# Patient Record
Sex: Male | Born: 1960 | Race: Black or African American | Hispanic: No | Marital: Married | Smoking: Former smoker
Health system: Southern US, Community
[De-identification: ages and names within clinical notes are randomized; demographics above are authoritative.]

## PROBLEM LIST (undated history)

## (undated) DIAGNOSIS — E119 Type 2 diabetes mellitus without complications: Secondary | ICD-10-CM

---

## 2012-12-27 ENCOUNTER — Emergency Department (HOSPITAL_BASED_OUTPATIENT_CLINIC_OR_DEPARTMENT_OTHER)
Admission: EM | Admit: 2012-12-27 | Discharge: 2012-12-27 | Disposition: A | Discharge status: Home / Self Care | Payer: 59 | Attending: Emergency Medicine | Admitting: Emergency Medicine

## 2012-12-27 ENCOUNTER — Encounter (HOSPITAL_BASED_OUTPATIENT_CLINIC_OR_DEPARTMENT_OTHER): Payer: Self-pay | Admitting: Emergency Medicine

## 2012-12-27 ENCOUNTER — Emergency Department (HOSPITAL_BASED_OUTPATIENT_CLINIC_OR_DEPARTMENT_OTHER): Payer: 59

## 2012-12-27 DIAGNOSIS — R071 Chest pain on breathing: Secondary | ICD-10-CM | POA: Insufficient documentation

## 2012-12-27 DIAGNOSIS — Z87891 Personal history of nicotine dependence: Secondary | ICD-10-CM | POA: Insufficient documentation

## 2012-12-27 DIAGNOSIS — E119 Type 2 diabetes mellitus without complications: Secondary | ICD-10-CM | POA: Insufficient documentation

## 2012-12-27 DIAGNOSIS — R0789 Other chest pain: Secondary | ICD-10-CM

## 2012-12-27 HISTORY — DX: Type 2 diabetes mellitus without complications: E11.9

## 2012-12-27 LAB — BASIC METABOLIC PANEL WITH GFR
BUN: 15 mg/dL (ref 6–23)
CO2: 25 meq/L (ref 19–32)
Calcium: 10.4 mg/dL (ref 8.4–10.5)
Chloride: 94 meq/L — ABNORMAL LOW (ref 96–112)
Creatinine, Ser: 0.8 mg/dL (ref 0.50–1.35)
GFR calc Af Amer: >90 mL/min (ref >90)
GFR calc non Af Amer: >90 mL/min (ref >90)
Glucose, Bld: 330 mg/dL — ABNORMAL HIGH (ref 70–99)
Potassium: 4.6 meq/L (ref 3.5–5.1)
Sodium: 134 meq/L — ABNORMAL LOW (ref 135–145)

## 2012-12-27 LAB — D-DIMER, QUANTITATIVE: D-Dimer, Quant: 0.87 ug{FEU}/mL — ABNORMAL HIGH (ref 0.00–0.48)

## 2012-12-27 MED ORDER — IOHEXOL 350 MG/ML SOLN
100.0000 mL | Freq: Once | INTRAVENOUS | Status: AC | PRN
  Administered 2012-12-27: 100 mL via INTRAVENOUS

## 2012-12-27 MED ORDER — IBUPROFEN 800 MG PO TABS: 800.0000 mg | ORAL_TABLET | Freq: Three times a day (TID) | ORAL | Status: DC

## 2012-12-27 MED ORDER — IBUPROFEN 800 MG PO TABS
800.0000 mg | ORAL_TABLET | Freq: Once | ORAL | Status: AC
  Administered 2012-12-27: 800 mg via ORAL
  Filled 2012-12-27: qty 1

## 2012-12-27 NOTE — ED Provider Notes (Signed)
CSN: [id number]     Arrival date & time 12/27/12  1539 History   First MD Initiated Contact with Patient 12/27/12 1548     Chief Complaint  Patient presents with  . Shortness of Breath   (Consider location/radiation/quality/duration/timing/severity/associated sxs/prior Treatment) Patient is a 52 y.o. male presenting with chest pain. The history is provided by the patient.  Chest Pain Pain location:  R chest Pain quality: sharp   Pain radiates to:  Does not radiate Pain radiates to the back: no   Pain severity:  Mild Onset quality:  Sudden Timing:  Intermittent Progression:  Unchanged Chronicity:  New Context: breathing (with deep breaths only)   Context: no drug use, not eating, no intercourse, not lifting, no movement, not raising an arm, not at rest, no stress and no trauma   Relieved by:  Nothing Worsened by:  Deep breathing Ineffective treatments:  None tried Associated symptoms: no abdominal pain, no cough, no fatigue, no fever, no heartburn, no nausea, no near-syncope, no orthopnea, no PND, no shortness of breath, no syncope and not vomiting   Risk factors: diabetes mellitus (controlled with diet), male sex and smoking (former, not current)   Risk factors: no aortic disease, no birth control, no coronary artery disease, no high cholesterol, not obese, no prior DVT/PE and no surgery     Past Medical History  Diagnosis Date  . Diabetes mellitus without complication    History reviewed. No pertinent past surgical history. No family history on file. History  Substance Use Topics  . Smoking status: Former Games developer  . Smokeless tobacco: Not on file  . Alcohol Use: Yes    Review of Systems  Constitutional: Negative for fever and fatigue.  Respiratory: Negative for cough and shortness of breath.   Cardiovascular: Positive for chest pain. Negative for orthopnea, leg swelling, syncope, PND and near-syncope.  Gastrointestinal: Negative for heartburn, nausea, vomiting and  abdominal pain.  All other systems reviewed and are negative.    Allergies  Review of patient's allergies indicates no known allergies.  Home Medications  No current outpatient prescriptions on file. BP 173/92  Pulse 83  Temp(Src) 99.5 F (37.5 C) (Oral)  Resp 18  Ht 6\' 2"  (1.88 m)  Wt 265 lb (120.203 kg)  BMI 34.01 kg/m2  SpO2 100% Physical Exam  Nursing note and vitals reviewed. Constitutional: He is oriented to person, place, and time. He appears well-developed and well-nourished. No distress.  HENT:  Head: Normocephalic and atraumatic.  Mouth/Throat: No oropharyngeal exudate.  Eyes: EOM are normal. Pupils are equal, round, and reactive to light.  Neck: Normal range of motion. Neck supple.  Cardiovascular: Normal rate and regular rhythm.  Exam reveals no friction rub.   No murmur heard. Pulmonary/Chest: Effort normal and breath sounds normal. No respiratory distress. He has no wheezes. He has no rales. He exhibits no tenderness.  Abdominal: He exhibits no distension. There is no tenderness. There is no rebound.  Musculoskeletal: Normal range of motion. He exhibits no edema.  Neurological: He is alert and oriented to person, place, and time. No cranial nerve deficit. He exhibits normal muscle tone. Coordination normal.  Skin: Skin is warm. No rash noted. He is not diaphoretic.    ED Course  Procedures (including critical care time) Labs Review Labs Reviewed - No data to display Imaging Review Dg Chest 2 View  12/27/2012   CLINICAL DATA:  Shortness of breath.  EXAM: CHEST  2 VIEW  COMPARISON:  06/06/2007.  FINDINGS: The heart  size and mediastinal contours are within normal limits. Both lungs are clear. The visualized skeletal structures are unremarkable.  IMPRESSION: No active cardiopulmonary disease.   Electronically Signed   By: Maisie Fus  Register   On: 12/27/2012 16:53   Ct Angio Chest Pe W/cm &/or Wo Cm  12/27/2012   CLINICAL DATA:  Shortness of breath, right  pleuritic chest pain, elevated D-dimer  EXAM: CT ANGIOGRAPHY CHEST WITH CONTRAST  TECHNIQUE: Multidetector CT imaging of the chest was performed using the standard protocol during bolus administration of intravenous contrast. Multiplanar CT image reconstructions including MIPs were obtained to evaluate the vascular anatomy.  CONTRAST:  OMNIPAQUE IOHEXOL 350 MG/ML SOLN  COMPARISON:  Chest radiographs dated 12/27/2012  FINDINGS: No evidence of pulmonary embolism.  Mild dependent atelectasis in the posterior/ dependent bilateral upper and lower lobes. No suspicious pulmonary nodules. No pleural effusion or pneumothorax.  Visualized thyroid is unremarkable.  The heart is normal in size. No pericardial effusion. Mild atherosclerotic calcifications of the aortic arch.  No suspicious mediastinal, hilar, or axillary lymphadenopathy.  Visualized upper abdomen is unremarkable.  Mild degenerative changes at T10-11.  Review of the MIP images confirms the above findings.  IMPRESSION: No evidence of pulmonary embolism.  No evidence of acute cardiopulmonary disease.   Electronically Signed   By: Charline Bills M.D.   On: 12/27/2012 17:46    EKG Interpretation    Date/Time:  Friday December 27 2012 15:51:39 EST Ventricular Rate:  83 PR Interval:  190 QRS Duration: 92 QT Interval:  358 QTC Calculation: 420 R Axis:   -17 Text Interpretation:  Normal sinus rhythm Normal ECG Confirmed by Gwendolyn Grant  MD, Hriday Stai ([id number]) on 12/27/2012 4:07:11 PM            MDM   1. Chest wall pain    6M presents with chest pain. Pleuritic, began last night. Only with deep breaths, and not with every deep breath. No fever, cough, hemoptysis, chest pain, DOE, N/V. Pain is felt at R anterior chest. Only hx of DM controlled with diet and exercise.  Tolerating PO well today. R anterior chest without ponit tenderness or reproducibility. No RUQ tenderness or epigastric pain. Lungs clear. Concern for possible PE. Chest pain not  consistent with ACS. Will check D-dimer as he is low risk and check CXR. Dimer elevated, CTA normal. Stable for discharge.   Dagmar Hait, MD 12/27/12 [phone]

## 2012-12-27 NOTE — ED Notes (Signed)
C/o sob/ pain to right rib area with deep breath-started last night

## 2012-12-27 NOTE — ED Notes (Signed)
MD at bedside. 

## 2013-01-23 ENCOUNTER — Emergency Department (HOSPITAL_BASED_OUTPATIENT_CLINIC_OR_DEPARTMENT_OTHER): Payer: 59

## 2013-01-23 ENCOUNTER — Encounter (HOSPITAL_BASED_OUTPATIENT_CLINIC_OR_DEPARTMENT_OTHER): Payer: Self-pay | Admitting: Emergency Medicine

## 2013-01-23 ENCOUNTER — Inpatient Hospital Stay (HOSPITAL_COMMUNITY): Payer: 59

## 2013-01-23 ENCOUNTER — Inpatient Hospital Stay (HOSPITAL_BASED_OUTPATIENT_CLINIC_OR_DEPARTMENT_OTHER)
Admission: EM | Admit: 2013-01-23 | Discharge: 2013-02-06 | DRG: 871 | Disposition: A | Discharge status: Home / Self Care | Payer: 59 | Attending: Internal Medicine | Admitting: Internal Medicine

## 2013-01-23 DIAGNOSIS — J189 Pneumonia, unspecified organism: Secondary | ICD-10-CM

## 2013-01-23 DIAGNOSIS — I472 Ventricular tachycardia, unspecified: Secondary | ICD-10-CM | POA: Diagnosis not present

## 2013-01-23 DIAGNOSIS — J9600 Acute respiratory failure, unspecified whether with hypoxia or hypercapnia: Secondary | ICD-10-CM

## 2013-01-23 DIAGNOSIS — N179 Acute kidney failure, unspecified: Secondary | ICD-10-CM

## 2013-01-23 DIAGNOSIS — I4891 Unspecified atrial fibrillation: Secondary | ICD-10-CM

## 2013-01-23 DIAGNOSIS — N058 Unspecified nephritic syndrome with other morphologic changes: Secondary | ICD-10-CM | POA: Diagnosis present

## 2013-01-23 DIAGNOSIS — G9340 Encephalopathy, unspecified: Secondary | ICD-10-CM | POA: Diagnosis present

## 2013-01-23 DIAGNOSIS — E1129 Type 2 diabetes mellitus with other diabetic kidney complication: Secondary | ICD-10-CM | POA: Diagnosis present

## 2013-01-23 DIAGNOSIS — I499 Cardiac arrhythmia, unspecified: Secondary | ICD-10-CM | POA: Diagnosis not present

## 2013-01-23 DIAGNOSIS — R0902 Hypoxemia: Secondary | ICD-10-CM

## 2013-01-23 DIAGNOSIS — J90 Pleural effusion, not elsewhere classified: Secondary | ICD-10-CM | POA: Diagnosis present

## 2013-01-23 DIAGNOSIS — N170 Acute kidney failure with tubular necrosis: Secondary | ICD-10-CM | POA: Diagnosis present

## 2013-01-23 DIAGNOSIS — E876 Hypokalemia: Secondary | ICD-10-CM

## 2013-01-23 DIAGNOSIS — D649 Anemia, unspecified: Secondary | ICD-10-CM | POA: Diagnosis present

## 2013-01-23 DIAGNOSIS — I10 Essential (primary) hypertension: Secondary | ICD-10-CM

## 2013-01-23 DIAGNOSIS — J869 Pyothorax without fistula: Secondary | ICD-10-CM | POA: Diagnosis present

## 2013-01-23 DIAGNOSIS — A419 Sepsis, unspecified organism: Principal | ICD-10-CM | POA: Diagnosis present

## 2013-01-23 DIAGNOSIS — R0603 Acute respiratory distress: Secondary | ICD-10-CM

## 2013-01-23 DIAGNOSIS — Z87891 Personal history of nicotine dependence: Secondary | ICD-10-CM

## 2013-01-23 DIAGNOSIS — E119 Type 2 diabetes mellitus without complications: Secondary | ICD-10-CM

## 2013-01-23 DIAGNOSIS — I4729 Other ventricular tachycardia: Secondary | ICD-10-CM | POA: Diagnosis not present

## 2013-01-23 LAB — CBC WITH DIFFERENTIAL/PLATELET
Band Neutrophils: 48 % — ABNORMAL HIGH (ref 0–10)
Basophils Absolute: 0 10*3/uL (ref 0.0–0.1)
Basophils Relative: 0 % (ref 0–1)
Blasts: 0 %
Eosinophils Relative: 0 % (ref 0–5)
HCT: 38.5 % — ABNORMAL LOW (ref 39.0–52.0)
Hemoglobin: 12.4 g/dL — ABNORMAL LOW (ref 13.0–17.0)
Lymphocytes Relative: 19 % (ref 12–46)
MCHC: 32.2 g/dL (ref 30.0–36.0)
MCV: 82.3 fL (ref 78.0–100.0)
Metamyelocytes Relative: 7 %
Monocytes Absolute: 0 10*3/uL — ABNORMAL LOW (ref 0.1–1.0)
Monocytes Relative: 0 % — ABNORMAL LOW (ref 3–12)
Neutro Abs: 5.3 10*3/uL (ref 1.7–7.7)
Neutrophils Relative %: 24 % — ABNORMAL LOW (ref 43–77)
Promyelocytes Absolute: 1 %
RBC: 4.68 MIL/uL (ref 4.22–5.81)
RDW: 13.4 % (ref 11.5–15.5)
nRBC: 0 /100 WBC

## 2013-01-23 LAB — URINALYSIS, ROUTINE W REFLEX MICROSCOPIC
Bilirubin Urine: NEGATIVE
Ketones, ur: >80 mg/dL — AB
Ketones, ur: NEGATIVE mg/dL
Leukocytes, UA: NEGATIVE
Leukocytes, UA: NEGATIVE
Nitrite: NEGATIVE
Nitrite: NEGATIVE
Protein, ur: 100 mg/dL — AB
Protein, ur: 100 mg/dL — AB
Urobilinogen, UA: 1 mg/dL (ref 0.0–1.0)
Urobilinogen, UA: 1 mg/dL (ref 0.0–1.0)
pH: 5.5 (ref 5.0–8.0)

## 2013-01-23 LAB — POCT I-STAT 3, ART BLOOD GAS (G3+)
Acid-Base Excess: 4 mmol/L — ABNORMAL HIGH (ref 0.0–2.0)
Acid-Base Excess: 5 mmol/L — ABNORMAL HIGH (ref 0.0–2.0)
Bicarbonate: 29.6 mEq/L — ABNORMAL HIGH (ref 20.0–24.0)
Bicarbonate: 28.1 mEq/L — ABNORMAL HIGH (ref 20.0–24.0)
O2 Saturation: 99 %
O2 Saturation: 91 %
O2 Saturation: 91 %
Patient temperature: 103
Patient temperature: 39.8
Patient temperature: 104.1
Patient temperature: 101.5
TCO2: 27 mmol/L (ref 0–100)
TCO2: 31 mmol/L (ref 0–100)
TCO2: 29 mmol/L (ref 0–100)
pCO2 arterial: 41.5 mmHg (ref 35.0–45.0)
pCO2 arterial: 51.9 mmHg — ABNORMAL HIGH (ref 35.0–45.0)
pCO2 arterial: 47.3 mmHg — ABNORMAL HIGH (ref 35.0–45.0)
pCO2 arterial: 33.7 mmHg — ABNORMAL LOW (ref 35.0–45.0)
pCO2 arterial: 38.8 mmHg (ref 35.0–45.0)
pH, Arterial: 7.417 (ref 7.350–7.450)
pH, Arterial: 7.386 (ref 7.350–7.450)
pH, Arterial: 7.525 — ABNORMAL HIGH (ref 7.350–7.450)
pO2, Arterial: 59 mmHg — ABNORMAL LOW (ref 80.0–100.0)

## 2013-01-23 LAB — GLUCOSE, CAPILLARY
Glucose-Capillary: 347 mg/dL — ABNORMAL HIGH (ref 70–99)
Glucose-Capillary: 334 mg/dL — ABNORMAL HIGH (ref 70–99)

## 2013-01-23 LAB — COMPREHENSIVE METABOLIC PANEL
ALT: 52 U/L (ref 0–53)
ALT: 52 U/L (ref 0–53)
AST: 52 U/L — ABNORMAL HIGH (ref 0–37)
Albumin: 2.1 g/dL — ABNORMAL LOW (ref 3.5–5.2)
Albumin: 1.7 g/dL — ABNORMAL LOW (ref 3.5–5.2)
Alkaline Phosphatase: 88 U/L (ref 39–117)
BUN: 11 mg/dL (ref 6–23)
CO2: 23 mEq/L (ref 19–32)
Calcium: 9.2 mg/dL (ref 8.4–10.5)
Creatinine, Ser: 0.5 mg/dL (ref 0.50–1.35)
GFR calc Af Amer: >90 mL/min (ref >90)
GFR calc Af Amer: >90 mL/min (ref >90)
GFR calc non Af Amer: >90 mL/min (ref >90)
GFR calc non Af Amer: >90 mL/min (ref >90)
Glucose, Bld: 366 mg/dL — ABNORMAL HIGH (ref 70–99)
Glucose, Bld: 393 mg/dL — ABNORMAL HIGH (ref 70–99)
Potassium: 3.9 mEq/L (ref 3.5–5.1)
Sodium: 127 mEq/L — ABNORMAL LOW (ref 135–145)
Total Protein: 7.4 g/dL (ref 6.0–8.3)

## 2013-01-23 LAB — URINE MICROSCOPIC-ADD ON

## 2013-01-23 LAB — PRO B NATRIURETIC PEPTIDE: Pro B Natriuretic peptide (BNP): 1140 pg/mL — ABNORMAL HIGH (ref 0–125)

## 2013-01-23 LAB — PROTIME-INR
INR: 1.23 (ref 0.00–1.49)
Prothrombin Time: 15.2 seconds (ref 11.6–15.2)

## 2013-01-23 LAB — PROCALCITONIN: Procalcitonin: 18.36 ng/mL

## 2013-01-23 LAB — CG4 I-STAT (LACTIC ACID): Lactic Acid, Venous: 1.74 mmol/L (ref 0.5–2.2)

## 2013-01-23 LAB — AMYLASE: Amylase: 26 U/L (ref 0–105)

## 2013-01-23 LAB — LIPASE, BLOOD: Lipase: 18 U/L (ref 11–59)

## 2013-01-23 LAB — APTT: aPTT: 21 seconds — ABNORMAL LOW (ref 24–37)

## 2013-01-23 LAB — TROPONIN I: Troponin I: 0.42 ng/mL (ref <0.30)

## 2013-01-23 MED ORDER — VANCOMYCIN HCL IN DEXTROSE 1-5 GM/200ML-% IV SOLN
INTRAVENOUS | Status: AC
  Administered 2013-01-23: 1 g via INTRAVENOUS
  Filled 2013-01-23: qty 200

## 2013-01-23 MED ORDER — IOHEXOL 350 MG/ML SOLN
100.0000 mL | Freq: Once | INTRAVENOUS | Status: AC | PRN
  Administered 2013-01-23: 100 mL via INTRAVENOUS

## 2013-01-23 MED ORDER — DEXTROSE 5 % IV SOLN
5.0000 ug/min | INTRAVENOUS | Status: DC
  Administered 2013-01-23: 5 ug/min via INTRAVENOUS
  Filled 2013-01-23: qty 4

## 2013-01-23 MED ORDER — BIOTENE DRY MOUTH MT LIQD
15.0000 mL | Freq: Four times a day (QID) | OROMUCOSAL | Status: DC
  Administered 2013-01-23 – 2013-01-26 (×10): 15 mL via OROMUCOSAL

## 2013-01-23 MED ORDER — VANCOMYCIN HCL IN DEXTROSE 1-5 GM/200ML-% IV SOLN
INTRAVENOUS | Status: AC
  Filled 2013-01-23: qty 200

## 2013-01-23 MED ORDER — MIDAZOLAM HCL 2 MG/2ML IJ SOLN
4.0000 mg | Freq: Once | INTRAMUSCULAR | Status: AC
  Administered 2013-01-23: 4 mg via INTRAVENOUS

## 2013-01-23 MED ORDER — SODIUM CHLORIDE 0.9 % IV BOLUS (SEPSIS): 1000.0000 mL | INTRAVENOUS | Status: DC | PRN

## 2013-01-23 MED ORDER — LORAZEPAM 2 MG/ML IJ SOLN
INTRAMUSCULAR | Status: AC
  Administered 2013-01-23: 0.5 mg
  Filled 2013-01-23: qty 1

## 2013-01-23 MED ORDER — INSULIN ASPART 100 UNIT/ML ~~LOC~~ SOLN
10.0000 [IU] | Freq: Once | SUBCUTANEOUS | Status: AC
  Administered 2013-01-23: 10 [IU] via SUBCUTANEOUS
  Filled 2013-01-23: qty 1

## 2013-01-23 MED ORDER — ACETAMINOPHEN 160 MG/5ML PO SOLN
650.0000 mg | ORAL | Status: DC | PRN
  Filled 2013-01-23: qty 20.3

## 2013-01-23 MED ORDER — ETOMIDATE 2 MG/ML IV SOLN
40.0000 mg | Freq: Once | INTRAVENOUS | Status: AC
  Administered 2013-01-23: 40 mg via INTRAVENOUS
  Filled 2013-01-23: qty 20

## 2013-01-23 MED ORDER — ETOMIDATE 2 MG/ML IV SOLN
INTRAVENOUS | Status: AC
  Filled 2013-01-23: qty 20

## 2013-01-23 MED ORDER — CHLORHEXIDINE GLUCONATE 0.12 % MT SOLN
15.0000 mL | Freq: Two times a day (BID) | OROMUCOSAL | Status: DC
  Administered 2013-01-23 – 2013-01-25 (×5): 15 mL via OROMUCOSAL
  Filled 2013-01-23 (×4): qty 15

## 2013-01-23 MED ORDER — SODIUM CHLORIDE 0.9 % IV SOLN
0.0000 ug/h | INTRAVENOUS | Status: DC
  Administered 2013-01-23: 100 ug/h via INTRAVENOUS
  Administered 2013-01-24 (×3): 350 ug/h via INTRAVENOUS
  Administered 2013-01-25: 100 ug/h via INTRAVENOUS
  Filled 2013-01-23 (×5): qty 50

## 2013-01-23 MED ORDER — VANCOMYCIN HCL IN DEXTROSE 1-5 GM/200ML-% IV SOLN
1000.0000 mg | Freq: Three times a day (TID) | INTRAVENOUS | Status: DC
  Administered 2013-01-23 – 2013-01-25 (×5): 1000 mg via INTRAVENOUS
  Filled 2013-01-23 (×8): qty 200

## 2013-01-23 MED ORDER — INSULIN REGULAR HUMAN 100 UNIT/ML IJ SOLN
INTRAMUSCULAR | Status: DC
  Administered 2013-01-23: 2.7 [IU]/h via INTRAVENOUS
  Administered 2013-01-24: 8.6 [IU]/h via INTRAVENOUS
  Filled 2013-01-23 (×2): qty 1

## 2013-01-23 MED ORDER — FENTANYL BOLUS VIA INFUSION
25.0000 ug | INTRAVENOUS | Status: DC | PRN
  Filled 2013-01-23: qty 50

## 2013-01-23 MED ORDER — INSULIN ASPART 100 UNIT/ML ~~LOC~~ SOLN: 0.0000 [IU] | SUBCUTANEOUS | Status: DC

## 2013-01-23 MED ORDER — SODIUM CHLORIDE 0.9 % IV BOLUS (SEPSIS)
500.0000 mL | Freq: Once | INTRAVENOUS | Status: AC
  Administered 2013-01-23: 500 mL via INTRAVENOUS

## 2013-01-23 MED ORDER — MIDAZOLAM HCL 2 MG/2ML IJ SOLN
2.0000 mg | INTRAMUSCULAR | Status: DC | PRN
  Administered 2013-01-23: 2 mg via INTRAVENOUS
  Filled 2013-01-23: qty 2

## 2013-01-23 MED ORDER — SODIUM CHLORIDE 0.9 % IV SOLN: 250.0000 mL | INTRAVENOUS | Status: DC | PRN

## 2013-01-23 MED ORDER — PIPERACILLIN-TAZOBACTAM 3.375 G IVPB
3.3750 g | Freq: Three times a day (TID) | INTRAVENOUS | Status: DC
  Administered 2013-01-23 – 2013-01-29 (×18): 3.375 g via INTRAVENOUS
  Filled 2013-01-23 (×19): qty 50

## 2013-01-23 MED ORDER — MIDAZOLAM HCL 2 MG/2ML IJ SOLN
INTRAMUSCULAR | Status: AC
  Filled 2013-01-23: qty 4

## 2013-01-23 MED ORDER — SUCCINYLCHOLINE CHLORIDE 20 MG/ML IJ SOLN
INTRAMUSCULAR | Status: AC
  Filled 2013-01-23: qty 1

## 2013-01-23 MED ORDER — ROCURONIUM BROMIDE 50 MG/5ML IV SOLN
INTRAVENOUS | Status: AC
  Filled 2013-01-23: qty 2

## 2013-01-23 MED ORDER — ALBUTEROL (5 MG/ML) CONTINUOUS INHALATION SOLN
20.0000 mg/h | INHALATION_SOLUTION | RESPIRATORY_TRACT | Status: AC
  Administered 2013-01-23: 20 mg/h via RESPIRATORY_TRACT
  Filled 2013-01-23: qty 20

## 2013-01-23 MED ORDER — LIDOCAINE HCL (CARDIAC) 20 MG/ML IV SOLN
INTRAVENOUS | Status: AC
  Filled 2013-01-23: qty 5

## 2013-01-23 MED ORDER — PANTOPRAZOLE SODIUM 40 MG IV SOLR
40.0000 mg | INTRAVENOUS | Status: DC
  Administered 2013-01-23 – 2013-01-25 (×3): 40 mg via INTRAVENOUS
  Filled 2013-01-23 (×4): qty 40

## 2013-01-23 MED ORDER — FENTANYL CITRATE 0.05 MG/ML IJ SOLN
INTRAMUSCULAR | Status: AC
  Filled 2013-01-23: qty 4

## 2013-01-23 MED ORDER — SODIUM CHLORIDE 0.9 % IV SOLN
INTRAVENOUS | Status: DC
  Administered 2013-01-23 – 2013-01-24 (×2): via INTRAVENOUS

## 2013-01-23 MED ORDER — PHENYLEPHRINE HCL 10 MG/ML IJ SOLN
30.0000 ug/min | INTRAVENOUS | Status: DC
  Administered 2013-01-23: 60 ug/min via INTRAVENOUS
  Filled 2013-01-23: qty 1

## 2013-01-23 MED ORDER — VANCOMYCIN HCL IN DEXTROSE 1-5 GM/200ML-% IV SOLN
1000.0000 mg | Freq: Once | INTRAVENOUS | Status: AC
  Administered 2013-01-23: 1000 mg via INTRAVENOUS

## 2013-01-23 MED ORDER — VANCOMYCIN HCL IN DEXTROSE 1-5 GM/200ML-% IV SOLN
1000.0000 mg | INTRAVENOUS | Status: AC
  Administered 2013-01-23: 1 g via INTRAVENOUS

## 2013-01-23 MED ORDER — VITAL AF 1.2 CAL PO LIQD
1000.0000 mL | ORAL | Status: DC
  Filled 2013-01-23 (×2): qty 1000

## 2013-01-23 MED ORDER — ROCURONIUM BROMIDE 50 MG/5ML IV SOLN
50.0000 mg | Freq: Once | INTRAVENOUS | Status: AC
  Administered 2013-01-23: 50 mg via INTRAVENOUS
  Filled 2013-01-23: qty 5

## 2013-01-23 MED ORDER — HEPARIN SODIUM (PORCINE) 5000 UNIT/ML IJ SOLN
5000.0000 [IU] | Freq: Three times a day (TID) | INTRAMUSCULAR | Status: DC
  Administered 2013-01-23 – 2013-02-06 (×41): 5000 [IU] via SUBCUTANEOUS
  Filled 2013-01-23 (×48): qty 1

## 2013-01-23 MED ORDER — NOREPINEPHRINE BITARTRATE 1 MG/ML IJ SOLN
5.0000 ug/min | INTRAVENOUS | Status: DC
  Administered 2013-01-23: 45 ug/min via INTRAVENOUS
  Administered 2013-01-23: 30 ug/min via INTRAVENOUS
  Filled 2013-01-23 (×2): qty 16

## 2013-01-23 MED ORDER — FENTANYL CITRATE 0.05 MG/ML IJ SOLN: 50.0000 ug | Freq: Once | INTRAMUSCULAR | Status: AC

## 2013-01-23 MED ORDER — CEFEPIME HCL 1 G IJ SOLR
INTRAMUSCULAR | Status: AC
  Filled 2013-01-23: qty 1

## 2013-01-23 MED ORDER — FENTANYL CITRATE 0.05 MG/ML IJ SOLN
200.0000 ug | Freq: Once | INTRAMUSCULAR | Status: AC
  Administered 2013-01-23: 200 ug via INTRAVENOUS

## 2013-01-23 MED ORDER — ACETAMINOPHEN 650 MG RE SUPP
RECTAL | Status: AC
  Filled 2013-01-23: qty 1

## 2013-01-23 MED ORDER — ACETAMINOPHEN 650 MG RE SUPP: 650.0000 mg | Freq: Once | RECTAL | Status: DC

## 2013-01-23 MED ORDER — FUROSEMIDE 10 MG/ML IJ SOLN
40.0000 mg | Freq: Once | INTRAMUSCULAR | Status: AC
  Administered 2013-01-23: 40 mg via INTRAVENOUS
  Filled 2013-01-23: qty 4

## 2013-01-23 MED ORDER — DEXTROSE 5 % IV SOLN
1.0000 g | Freq: Once | INTRAVENOUS | Status: AC
  Administered 2013-01-23: 14:00:00 via INTRAVENOUS
  Filled 2013-01-23: qty 1

## 2013-01-23 NOTE — ED Notes (Signed)
Family at bedside. 

## 2013-01-23 NOTE — ED Notes (Signed)
Pt. Just returned from Radiology CT scan.  Pt. Transported by RN and RT and Radiology staff.   Pt. Had no change in self or status while in radiology and none upon return.  Pt. Still noted with BiPAP.

## 2013-01-23 NOTE — Progress Notes (Signed)
CRITICAL VALUE ALERT  Critical value received:  Troponin .50  Date of notification:  01/23/13  Time of notification:  2025  Critical value read back:yes  Nurse who received alert: Crist Fat RN(notified pt's primary RN Delsa Sale)  MD notified (1st page):  Dr. Tyson Alias  Time of first page:  2028  MD notified (2nd page):NA  Time of second page:NA  Responding MD:  Tyson Alias  Time MD responded:  2029

## 2013-01-23 NOTE — ED Notes (Signed)
MD at bedside. 

## 2013-01-23 NOTE — ED Notes (Signed)
Troponin is 0.44

## 2013-01-23 NOTE — ED Notes (Signed)
Pt sts shob which started this morning upon waking, denies cp, confirms cough, yellow productive. Denies fever, but feels warm per wife and has had chills. Denies N/V/D. Wife sts he was in hospital 1 week ago for hyperglycemia, was given IV fluids and started on Glyburide.

## 2013-01-23 NOTE — ED Notes (Signed)
EDP and Critical Care Specialist spoke via phone.  Pt. Will be transported with BiPAP and this was communicated to the family.  Pt. Aware of what is going on and the transport.

## 2013-01-23 NOTE — Progress Notes (Signed)
eLink Physician-Brief Progress Note Patient Name: Stephen Stanton DOB: February 24, 1960 MRN: [id number]  Date of Service  01/23/2013   HPI/Events of Note   fever  eICU Interventions  Add tylenal, lft wnl   Intervention Category Major Interventions: Respiratory failure - evaluation and management  Nelda Bucks. 01/23/2013, 8:14 PM

## 2013-01-23 NOTE — ED Notes (Signed)
Family at bedside with no questions at present time.

## 2013-01-23 NOTE — ED Provider Notes (Signed)
CSN: [id number]     Arrival date & time 01/23/13  1337 History   First MD Initiated Contact with Patient 01/23/13 1357     Chief Complaint  Patient presents with  . Respiratory Distress   (Consider location/radiation/quality/duration/timing/severity/associated sxs/prior Treatment) The history is provided by the spouse and medical records. No language interpreter was used.    Izsak Stanton is a 52 y.o. male  with a hx of new onset diabetes (diagnosed one week ago at a hospital in [place] with brief admission) presents to the Emergency Department complaining of acute onset shortness of breath and fever to 103.  Patient is a level V caveat due to respiratory distress, acuity of condition. History is obtained from the wife.  She reports at 29 AM patient had an acute onset of shortness of breath and fever.  She reports it progressed very quickly.  Patient returned from [facility] yesterday in the car. Wife reports swelling of his legs which she noticed last night which seemed worse this morning.  She denies any further known medical history.  He was given glipizide for his diabetes and no other medications.  She denies cardiac history, high cholesterol, hypertension, or history of MI.  Past Medical History  Diagnosis Date  . Diabetes mellitus without complication    History reviewed. No pertinent past surgical history. History reviewed. No pertinent family history. History  Substance Use Topics  . Smoking status: Former Games developer  . Smokeless tobacco: Not on file  . Alcohol Use: No    Review of Systems  Unable to perform ROS: Acuity of condition    Allergies  Review of patient's allergies indicates no known allergies.  Home Medications   No current outpatient prescriptions on file. BP 108/70  Pulse 101  Temp(Src) 101.5 F (38.6 C) (Rectal)  Resp 23  Ht 6' (1.829 m)  Wt 245 lb (111.131 kg)  BMI 33.22 kg/m2  SpO2 98% Physical Exam  Nursing note and vitals  reviewed. Constitutional: He is oriented to person, place, and time. He appears well-developed and well-nourished. He appears distressed.  Patient diaphoretic and distressed, difficulty breathing, speaking in less than one-word sentences, gasping for air  HENT:  Head: Normocephalic and atraumatic.  Mouth/Throat: No oropharyngeal exudate.  Eyes: Conjunctivae are normal. Pupils are equal, round, and reactive to light. No scleral icterus.  Neck: Normal range of motion. Neck supple.  Cardiovascular: Regular rhythm, normal heart sounds and intact distal pulses.   Tachycardic to 160  Pulmonary/Chest: Accessory muscle usage present. Tachypnea noted. He is in respiratory distress. He has decreased breath sounds. He has rhonchi. He has rales.  Absent breath sounds on the right Rales and rhonchi on the left Initial oxygen saturation on room air 77% Belly breathing with accessory muscle use and intercostal retractions; tachypnea  Abdominal: Soft. Bowel sounds are normal. He exhibits no distension. There is no tenderness.  Abdomen nondistended Nontender  Musculoskeletal: Normal range of motion. He exhibits edema.  2+ pitting edema bilateral lower extremities from distal forefoot to calf region No palpable cords  Lymphadenopathy:    He has no cervical adenopathy.  Neurological: He is alert and oriented to person, place, and time.  Speech is clear and goal oriented Moves extremities without ataxia  Skin: Skin is warm. No rash noted. He is diaphoretic. No erythema.  Psychiatric: He has a normal mood and affect.    ED Course  Procedures (including critical care time) Labs Review Labs Reviewed  CBC WITH DIFFERENTIAL - Abnormal; Notable for  the following:    Hemoglobin 12.4 (*)    HCT 38.5 (*)    Neutrophils Relative % 24 (*)    Monocytes Relative 0 (*)    Band Neutrophils 48 (*)    Monocytes Absolute 0.0 (*)    All other components within normal limits  COMPREHENSIVE METABOLIC PANEL -  Abnormal; Notable for the following:    Sodium 127 (*)    Chloride 85 (*)    Glucose, Bld 366 (*)    Total Protein 8.5 (*)    Albumin 2.1 (*)    AST 52 (*)    All other components within normal limits  TROPONIN I - Abnormal; Notable for the following:    Troponin I 0.44 (*)    All other components within normal limits  D-DIMER, QUANTITATIVE - Abnormal; Notable for the following:    D-Dimer, Quant 4.72 (*)    All other components within normal limits  GLUCOSE, CAPILLARY - Abnormal; Notable for the following:    Glucose-Capillary 347 (*)    All other components within normal limits  URINALYSIS, ROUTINE W REFLEX MICROSCOPIC - Abnormal; Notable for the following:    Specific Gravity, Urine 1.036 (*)    Glucose, UA >1000 (*)    Hgb urine dipstick MODERATE (*)    Ketones, ur >80 (*)    Protein, ur 100 (*)    All other components within normal limits  PRO B NATRIURETIC PEPTIDE - Abnormal; Notable for the following:    Pro B Natriuretic peptide (BNP) 488.1 (*)    All other components within normal limits  COMPREHENSIVE METABOLIC PANEL - Abnormal; Notable for the following:    Sodium 130 (*)    Chloride 88 (*)    Glucose, Bld 393 (*)    Calcium 8.3 (*)    Albumin 1.7 (*)    AST 55 (*)    All other components within normal limits  TROPONIN I - Abnormal; Notable for the following:    Troponin I 0.50 (*)    All other components within normal limits  LACTIC ACID, PLASMA - Abnormal; Notable for the following:    Lactic Acid, Venous 3.4 (*)    All other components within normal limits  PRO B NATRIURETIC PEPTIDE - Abnormal; Notable for the following:    Pro B Natriuretic peptide (BNP) 1140.0 (*)    All other components within normal limits  APTT - Abnormal; Notable for the following:    aPTT 21 (*)    All other components within normal limits  GLUCOSE, CAPILLARY - Abnormal; Notable for the following:    Glucose-Capillary 408 (*)    All other components within normal limits  GLUCOSE,  CAPILLARY - Abnormal; Notable for the following:    Glucose-Capillary 334 (*)    All other components within normal limits  POCT I-STAT 3, BLOOD GAS (G3+) - Abnormal; Notable for the following:    pO2, Arterial 54.0 (*)    Bicarbonate 26.1 (*)    All other components within normal limits  POCT I-STAT 3, BLOOD GAS (G3+) - Abnormal; Notable for the following:    pCO2 arterial 51.9 (*)    pO2, Arterial 160.0 (*)    Bicarbonate 29.6 (*)    Acid-Base Excess 4.0 (*)    All other components within normal limits  POCT I-STAT 3, BLOOD GAS (G3+) - Abnormal; Notable for the following:    pCO2 arterial 47.3 (*)    pO2, Arterial 72.0 (*)    Bicarbonate 27.6 (*)  Acid-Base Excess 3.0 (*)    All other components within normal limits  POCT I-STAT 3, BLOOD GAS (G3+) - Abnormal; Notable for the following:    pH, Arterial 7.525 (*)    pCO2 arterial 33.7 (*)    pO2, Arterial 139.0 (*)    Bicarbonate 27.5 (*)    Acid-Base Excess 5.0 (*)    All other components within normal limits  CULTURE, BLOOD (ROUTINE X 2)  CULTURE, BLOOD (ROUTINE X 2)  CULTURE, BLOOD (ROUTINE X 2)  CULTURE, BLOOD (ROUTINE X 2)  CULTURE, RESPIRATORY (NON-EXPECTORATED)  URINE CULTURE  BODY FLUID CULTURE  URINE MICROSCOPIC-ADD ON  AMYLASE  LIPASE, BLOOD  PROTIME-INR  BLOOD GAS, ARTERIAL  TROPONIN I  TROPONIN I  CORTISOL  URINALYSIS, ROUTINE W REFLEX MICROSCOPIC  STREP PNEUMONIAE URINARY ANTIGEN  LEGIONELLA ANTIGEN, URINE  BLOOD GAS, ARTERIAL  BLOOD GAS, VENOUS  CBC  BASIC METABOLIC PANEL  BLOOD GAS, ARTERIAL  MAGNESIUM  PHOSPHORUS  BLOOD GAS, ARTERIAL  BLOOD GAS, ARTERIAL  PROCALCITONIN  PROCALCITONIN  CG4 I-STAT (LACTIC ACID)   Imaging Review Ct Angio Chest Pe W/cm &/or Wo Cm  01/23/2013   CLINICAL DATA:  Fever.  Leg swelling.  Diabetes.  Short of breath.  EXAM: CT ANGIOGRAPHY CHEST WITH CONTRAST  TECHNIQUE: Multidetector CT imaging of the chest was performed using the standard protocol during bolus  administration of intravenous contrast. Multiplanar CT image reconstructions including MIPs were obtained to evaluate the vascular anatomy.  CONTRAST:  OMNIPAQUE IOHEXOL 350 MG/ML SOLN  COMPARISON:  Chest x-ray today.  Chest CT 12/27/2012  FINDINGS: The patient was not able to hold still and there is considerable motion on the study. The study is of marginal quality for pulmonary embolism due to motion. No central pulmonary emboli are identified. Small distal emboli could be missed on the study. There are some questionable filling defects the left upper lobe which are attributable to motion.  Extensive infiltrate throughout most of the right lung with air bronchograms consistent with pneumonia. Left lower lobe infiltrate with air bronchograms consistent with pneumonia.  Large loculated right pleural effusion containing an air-fluid level. This is most consistent with empyema. No significant pleural effusion on the left.  Negative for mass or adenopathy.  No acute bony changes.  Review of the MIP images confirms the above findings.  IMPRESSION: Extensive motion limits the ability to detect pulmonary emboli on the study. No large central emboli are identified.  Extensive bilateral pneumonia, right greater than left.  Large empyema with air-fluid level on the right.  The findings were discussed by telephone between Dr. Signa Kell and Dr. Tyson Alias.   Electronically Signed   By: Marlan Palau M.D.   On: 01/23/2013 16:09   Dg Chest Port 1 View  01/23/2013   CLINICAL DATA:  Chest tube placement  EXAM: PORTABLE CHEST - 1 VIEW  COMPARISON:  01/23/2013  FINDINGS: Endotracheal tube in good position. Right jugular catheter in the SVC. No pneumothorax.  NG tube tip at the GE junction with the proximal side hole in distal esophagus.  Interval placement of right-sided chest tube in the right lung base. Decreased and right pleural effusion.  Extensive pneumonia in both lung bases again noted and unchanged.   IMPRESSION: Satisfactory central line placement.  Satisfactory right chest tube with decrease in right pleural effusion  Endotracheal tube remains in good position.  Advance NG tube.   Electronically Signed   By: Marlan Palau M.D.   On: 01/23/2013 19:18  Dg Chest Port 1 View  01/23/2013   CLINICAL DATA:  Intubation. Central venous catheter placement. Right lung pneumonia and right side empyema on earlier imaging.  EXAM: PORTABLE CHEST - 1 VIEW 01/23/2013 1809 hr:  COMPARISON:  Portable chest x-ray earlier same date 1402 hr and CTA chest earlier same date.  FINDINGS: Endotracheal tube tip in satisfactory position projecting approximately 6 cm above the carina. Right jugular central venous catheter tip projects over the upper SVC. No evidence of mediastinal hematoma or pneumothorax. Dense consolidation throughout the right lower lobe, right middle lobe, and much of the right upper lobe. Dense consolidation in the left lower lobe. No new pulmonary parenchymal abnormalities since earlier same date.  IMPRESSION: 1. Endotracheal tube tip in satisfactory position projecting approximately 6 cm above the carina. 2. Right jugular central venous catheter tip projects over the upper SVC. No acute complicating features. 3. Stable severe pneumonia involving much of the right lung and the left lower lobe since earlier in the day. No new abnormalities.   Electronically Signed   By: Hulan Saas M.D.   On: 01/23/2013 18:24   Dg Chest Port 1 View  01/23/2013   CLINICAL DATA:  Fever and shortness of breath  EXAM: PORTABLE CHEST - 1 VIEW  COMPARISON:  December 27, 2012 chest radiograph and chest CT angiogram  FINDINGS: There is a large pleural effusion occupying much of the right hemithorax. There is consolidation in both lower lobes. There is diffuse interstitial edema. Heart is mildly enlarged with pulmonary venous hypertension. No bony lesions are appreciated. No appreciable pneumothorax.  IMPRESSION: The findings  consistent with congestive heart failure. Note that there is a large pleural effusion on the right, occupying much of the right hemithorax. Superimposed pneumonia in the lower lobes cannot be excluded on this study.   Electronically Signed   By: Bretta Bang M.D.   On: 01/23/2013 14:27    EKG Interpretation    Date/Time:  Thursday January 23 2013 14:04:07 EST Ventricular Rate:  142 PR Interval:  180 QRS Duration: 80 QT Interval:  354 QTC Calculation: 544 R Axis:   -23 Text Interpretation:  ;  Abnormal ECG Confirmed by WARD  DO, KRISTEN ([id number]) on 01/23/2013 2:22:20 PM           CRITICAL CARE Performed by: Dierdre Forth Total critical care time: 1 hour Critical care time was exclusive of separately billable procedures and treating other patients. Critical care was necessary to treat or prevent imminent or life-threatening deterioration. Critical care was time spent personally by me on the following activities: development of treatment plan with patient and/or surrogate as well as nursing, discussions with consultants, evaluation of patient's response to treatment, examination of patient, obtaining history from patient or surrogate, ordering and performing treatments and interventions, ordering and review of laboratory studies, ordering and review of radiographic studies, pulse oximetry and re-evaluation of patient's condition.   MDM   1. Respiratory distress   2. Diabetes   3. Hypoxia   4. Acute respiratory failure   5. DM (diabetes mellitus)   6. Empyema   7. HCAP (healthcare-associated pneumonia)      Stephen Stanton presents with severe respiratory distress. Patient with significant risk factors for PE including bilateral pitting edema and rare trip yesterday. Patient with recent and 3 for hospitalization for new onset diabetes. Patient without cardiac history but unclear etiology of respiratory distress, congestive heart failure versus PE versus pneumonia.     Patient's initial oxygen saturation 77% on  room air. He was placed on nonrebreather with increase to 86%. Patient then placed on BiPAP with improvement.  Initial blood gas without acidosis but significant hypoxemia; improved after BiPAP placement.  D-dimer 4.72 and troponin 0.44. No STEMI noted on patient's EKG but he was tachycardic to 160 on arrival.  Troponin likely secondary to troponin only from tachycardia and hypoxemia. Lasix 80 mg IV given.  Chest x-ray with large pleural effusion. We'll begin cefepime and vancomycin for pneumonia coverage.  Patient also with hyperglycemia and gap of 19.  We'll give insulin subcutaneous.   Patient has remained on BiPAP. We'll consult critical care.    15:18 PM Discussed with Dr. Tyson Alias of critical care who will admit to 2100.  Review of the CT angiochest images (without radiology read) shows likely empyema on the right.  No visible pulmonary embolus.    CT NG ago with extensive bilateral pneumonia right greater than left and large empyema on the right. No evidence of pulmonary emboli though exam is motion degraded.  I personally reviewed the imaging tests through PACS system  I reviewed available ER/hospitalization records through the EMR  Patient remains tachycardic and tachypneic but is stable for transfer at this time as benefits outweigh risks and pt needs chest tube.  Patient was transferred to [facility] intensive care; 2100.    The patient was discussed with and seen by Dr. Zettie Cooley who agrees with the treatment plan.     Dahlia Client Laylynn Campanella, PA-C 01/23/13 2224

## 2013-01-23 NOTE — ED Notes (Signed)
Pt. Communicating by cell phone at present time.

## 2013-01-23 NOTE — ED Notes (Signed)
Pt. Has F/C placed and Resp at bedside.  Pt. Is less active

## 2013-01-23 NOTE — ED Notes (Signed)
Pt. Is on BiPAP at present time with Resp. Brett Canales at bedside.

## 2013-01-23 NOTE — ED Notes (Signed)
Vital signs stable. 

## 2013-01-23 NOTE — ED Notes (Signed)
EDP at bedside of pt.   Explaining to family about pt. Status and poss. Need for airway intubation.

## 2013-01-23 NOTE — Procedures (Signed)
Name:  Stephen Stanton MRN:  [id number] DOB:  [birth year]  PROCEDURE NOTE  Procedure:  Tube thoracotomy  Indications:  Empyema  Consent:  Procedure, benefits, risks and alternatives discussed.  Questions answered.  Consent obtained from medical POA (wife).  Anesthesia:  On continuous sedation as intubated + local with 1% Lidocaine.  Procedure summary:  Appropriate equipment was assembled.  The patient was identified as Stephen Stanton and safety timeout was performed. The patient was placed supine with a pillow under his R chest.  Sterile technique was used. The patient's R lateral chest was prepped using chlorhexidine / alcohol scrub and the field was draped in usual sterile fashion. The diaphragm, free-floating pleural fluid and the lung were identified by ultrasound. After the adequate anesthesia was achieved, tube thoracotomy was performed with 28 Fr chest tube after some pleural adhesions were manually dissected. A total of 1200 mL of thick purulent foul smelling fluid was aspirated. Catheter was secured. Sterile dressing was applied. Post-procedure chest x-ray was ordered.  Specimens sent:  Pleural fluid for G-stain and culture  Complications:  No immediate complications were noted.  Hemodynamic parameters and oxygenation remained stable throughout the procedure.  Estimated blood loss:  Less then 25 mL.  Orlean Bradford, M.D. Pulmonary and Critical Care Medicine [facility] Pager: [phone]  01/26/2013, 1:09 PM

## 2013-01-23 NOTE — ED Notes (Signed)
Care Link in ED assessing Pt. Resp. Status.  Pt. Able to communicate by Eyes and movement of Head.  Dr. Herma Carson EDP assessed Pt. Resp. Status and is assessing situation of Pt.   Care Link standing for possible intubation of Pt.   Pt. Family at bedside.  Pt. Family given full explanation of process and all that is going on with the Pt. Care. Pt. Family asked questions about Pt. Care and were given full explanations of care and poss. Intubation.

## 2013-01-23 NOTE — Procedures (Signed)
Name:  Stephen Stanton MRN:  [id number] DOB:  [birth year]  PROCEDURE NOTE  Procedure:  Ultrasound-guided central venous catheter placement.  Indications:  Need for intravenous access and hemodynamic monitoring.  Consent:  Consent was implied due to the emergency nature of the procedure.  Anesthesia:  A total of 10 mL of 1% Lidocaine was used for local infiltration anesthesia.  Procedure summary:  Appropriate equipment was assembled.  The patient was identified as Stephen Stanton and safety timeout was performed. The patient was placed in Trendelenburg position.  Sterile technique was used. The patient's R neck region was prepped using chlorhexidine / alcohol scrub and the field was draped in usual sterile fashion with full body drape. The R internal jugular vein and the R carotid artery were identified by ultrasound, the patency was evaluated and images were documented. After the adequate anesthesia was achieved, the vein was cannulated with the introducer needle under sonographic guidance without difficulty. A guide wire was advanced through the introducer needle, which was then withdrawn. A small skin incision was made at the point of wire entry, the dilator was inserted over the guide wire and appropriate dilation was obtained. The dilator was removed and triple-lumen catheter was advanced over the guide wire, which was then removed.  All ports were aspirated and flushed with normal saline without difficulty. The catheter was secured into place with sutures. Antibiotic patch was placed and sterile dressing was applied. Post-procedure chest x-ray was ordered.  Complications:  No immediate complications were noted.  Hemodynamic parameters and oxygenation remained stable throughout the procedure.  Estimated blood loss:  Less then 5 mL.  Orlean Bradford, M.D. Pulmonary and Critical Care Medicine [facility] Pager: [phone]  01/23/2013, 9:39 PM

## 2013-01-23 NOTE — ED Notes (Signed)
Pt. At this time getting 40 mg of Lasix.

## 2013-01-23 NOTE — ED Provider Notes (Signed)
Medical screening examination/treatment/procedure(s) were conducted as a shared visit with non-physician practitioner(s) and myself.  I personally evaluated the patient during the encounter.  EKG Interpretation    Date/Time:  Thursday January 23 2013 14:04:07 EST Ventricular Rate:  142 PR Interval:  180 QRS Duration: 80 QT Interval:  354 QTC Calculation: 544 R Axis:   -23 Text Interpretation:  ;  Abnormal ECG Confirmed by Margareth Kanner  DO, Demir Titsworth ([id number]) on 01/23/2013 2:22:20 PM           EKG shows significant artifact, nonspecific ST changes, PVCs, sinus tachycardia  Patient is a 52 year old male with a history of diabetes who presents the emergency department with sudden onset shortness of breath and lower extremity swelling that started this morning. Patient has had a fever of 103. He recently drove back from [place] last night. Wife reports he was in the hospital one week ago for hyperglycemia. They deny a prior history of PE or DVT, CHF, MI, pneumonia, COPD or asthma.  On exam, patient is febrile, tachycardic, tachypneic, oxygen saturation in the 70s on remainder. He has coarse, rhonchorous breath sounds bilaterally and pitting edema to bilateral lower extremities. Concern for possible pneumonia versus CHF versus pulmonary embolus. Will obtain labs with cultures, chest x-ray, cardiac labs. Will give IV Lasix and broad spectrum IV antibiotics. EKG shows no ST changes but given patient education and tachypnea, there is significant artifact on multiple EKGs. We'll start BiPAP immediately. Patient's blood gas shows a pH of 7.4 with hypoxia but no CO2 retention. Patient will need admission to the ICU. Discussed with wife who confirms patient is full code and if needed they would want intubation.   3:09 PM  Pt's tachycardia and tachypnea are improving on BiPAP. History of pain in his elevated I suspect this is do to his tachycardia, strain. D-dimer is also elevated. Patient is more stable, will  obtain CT chest to rule out pulmonary embolus given his recent travel and hospitalization. He has a right-sided pleural effusion seen on chest x-ray. Will discuss with critical care for admission.  Layla Maw Avelino Herren, DO 01/23/13 1510

## 2013-01-23 NOTE — Procedures (Signed)
Name: [name] MRN: [id number] DOB: Jan 24, 1961   PROCEDURE NOTE  Procedure:  Endotracheal intubation.  Indication:  Acute respiratory failure  Consent:  Consent was implied due to the emergency nature of the procedure.  Anesthesia:  A total of 10 mg of Etomidate was given intravenously.  Procedure summary:  Appropriate equipment was assembled. The patient was identified as Stephen Stanton and safety timeout was performed. The patient was placed supine, with head in sniffing position. After adequate level of anesthesia was achieved, a GS#3 blade was inserted into the oropharynx and the vocal cords were visualized. A 8.0 endotracheal tube was inserted without difficulty and visualized going through the vocal cords. The stylette was removed and cuff inflated. Colorimetric change was noted on the CO2 meter. Breath sounds were heard over both lung fields equally. ETT was secured at 24 cm lip line.  Post procedure chest xray was ordered.  Complications:  No immediate complications were noted.  Hemodynamic parameters and oxygenation remained stable throughout the procedure.    Orlean Bradford, M.D. Pulmonary and Critical Care Medicine [facility] Pager: [phone]  01/23/2013, 9:37 PM

## 2013-01-23 NOTE — ED Notes (Signed)
Reported blood gas to [facility]

## 2013-01-23 NOTE — ED Notes (Signed)
Patient is resting comfortably. 

## 2013-01-23 NOTE — H&P (Signed)
PULMONARY / CRITICAL CARE MEDICINE  Name: Stephen Stanton MRN: [id number] DOB: 08/12/1960    ADMISSION DATE:  01/23/2013 CONSULTATION DATE:  01/23/2013  REFERRING MD :  HP EDP PRIMARY SERVICE:  PCCM  CHIEF COMPLAINT:  Shortness of breath  BRIEF PATIENT DESCRIPTION: 52 yo with past medical history of new onset diabetes ( brief admission one week ago) brought to [facility] ED with with dyspnea and fever.  In ED required BiPAP. Chest imaging demonstrated bilateral pneumonia and likely empyema on the R.  Transferred [facility] for further management.  SIGNIFICANT EVENTS / STUDIES:  12/25  Presented to [facility] ED with respiratory distress and fever 12/25  CTA chest: No central PE, extensive bilateral pneumonia R>L, large empyema with air fluid level on the right 12/25  Case discussed with TCTS >>> Chest tube by PCCM, TCTS will evaluate in am 12/25  Transferred to [facility]  LINES / TUBES: OETT 12/25 >>> OGT 12/25 >>> R IJ TLC 12/25 >>> Foley 12/25 >>>  CULTURES: 12/25 Blood >>> 12/25 Urine >>> 12/25 Respiratory >>> 12/25 Pleural fluid >>>  ANTIBIOTICS: Cefepime 12/25 x 1 Zosyn 12/25 >>> Vancomycin 12/25 >>>  The patient is on NIMV and unable to provide history, which was obtained for available medical records.  HISTORY OF PRESENT ILLNESS:  52 yo with past medical history of new onset diabetes ( brief admission one week ago) brought to [facility] ED with with dyspnea and fever.  In ED required BiPAP. Chest imaging demonstrated bilateral pneumonia and likely empyema on the R.  Transferred [facility] for further management.  PAST MEDICAL HISTORY :  Past Medical History  Diagnosis Date  . Diabetes mellitus without complication    History reviewed. No pertinent past surgical history. Prior to Admission medications   Medication Sig Start Date End Date Taking? Authorizing Provider  ibuprofen (ADVIL,MOTRIN) 800 MG tablet Take 1 tablet (800 mg total) by mouth 3 (three) times daily. 12/27/12   Dagmar Hait, MD   No  Known Allergies  FAMILY HISTORY:  History reviewed. No pertinent family history.  SOCIAL HISTORY:  reports that he has quit smoking. He does not have any smokeless tobacco history on file. He reports that he does not drink alcohol or use illicit drugs.  REVIEW OF SYSTEMS:  Unable to provide.  INTERVAL HISTORY:  VITAL SIGNS: Temp:  [100.8 F (38.2 C)-103.6 F (39.8 C)] 101.5 F (38.6 C) (12/25 1916) Pulse Rate:  [101-160] 101 (12/25 1916) Resp:  [0-51] 23 (12/25 1916) BP: (63-166)/(42-106) 108/70 mmHg (12/25 1916) SpO2:  [80 %-100 %] 98 % (12/25 1916) FiO2 (%):  [100 %] 100 % (12/25 2032) Weight:  [111.131 kg (245 lb)] 111.131 kg (245 lb) (12/25 1351) HEMODYNAMICS:   VENTILATOR SETTINGS: Vent Mode:  [-] PRVC FiO2 (%):  [100 %] 100 % Set Rate:  [24 bmp-28 bmp] 28 bmp Vt Set:  [540 mL-620 mL] 540 mL PEEP:  [5 cmH20-10 cmH20] 10 cmH20 Plateau Pressure:  [27 cmH20-31 cmH20] 27 cmH20 INTAKE / OUTPUT: Intake/Output     12/25 0701 - 12/26 0700   Urine (mL/kg/hr) 950   Total Output 950   Net -950         PHYSICAL EXAMINATION: General:  Appears acutely ill, mechanically ventilated, synchronous Neuro:  Encephalopathic, nonfocal, cough / gag diminished HEENT:  PERRL, OETT / OGT Cardiovascular:  RRR, no m/r/g Lungs:  Bilateral diminished air entry, no w/r/r Abdomen:  Soft, nontender, bowel sounds diminished Musculoskeletal:  Moves all extremities, no edema Skin:  Intact  LABS:  CBC  Recent Labs Lab 01/23/13 1410  WBC 6.5  HGB 12.4*  HCT 38.5*  PLT 248   Coag's  Recent Labs Lab 01/23/13 1830  APTT 21*  INR 1.23   BMET  Recent Labs Lab 01/23/13 1410 01/23/13 1830  NA 127* 130*  K 3.7 3.9  CL 85* 88*  CO2 23 28  BUN 10 11  CREATININE 0.50 0.65  GLUCOSE 366* 393*   Electrolytes  Recent Labs Lab 01/23/13 1410 01/23/13 1830  CALCIUM 9.2 8.3*   Sepsis Markers  Recent Labs Lab 01/23/13 1431 01/23/13 1830  LATICACIDVEN 1.74 3.4*    ABG  Recent Labs Lab 01/23/13 1454 01/23/13 2024 01/23/13 2143  PHART 7.375 7.386 7.525*  PCO2ART 51.9* 47.3* 33.7*  PO2ART 160.0* 72.0* 139.0*   Liver Enzymes  Recent Labs Lab 01/23/13 1410 01/23/13 1830  AST 52* 55*  ALT 52 52  ALKPHOS 97 88  BILITOT 0.4 0.6  ALBUMIN 2.1* 1.7*   Cardiac Enzymes  Recent Labs Lab 01/23/13 1410 01/23/13 1749  TROPONINI 0.44* 0.50*  PROBNP 488.1* 1140.0*   Glucose  Recent Labs Lab 01/23/13 1407 01/23/13 1735 01/23/13 1959  GLUCAP 347* 408* 334*   CXR:  12/25 >>> Bilateral airspace disease R>L, loculated effusion R  ASSESSMENT / PLAN:  PULMONARY A:  Bilateral pneumonia (R>L) / HCAP (recent hospitalization). Empyema. Acute hypoxemic respiratory failure. P: Intubate   Goal SpO2>92, pH>7.30 Full mechanical support Daily SBT Ventilator bundle Trend ABG / CXR Tube thoracotomy TCTS consulted  CARDIOVASCULAR A: Sepsis. P:  Goal MAP>60 Levophed gtt Trend lactate  RENAL A:  No active issues. P:   Goal CVP 10-12 Trend BMP NS@75   GASTROINTESTINAL A:  No active issues. P:   NPO as intubated TF per Nutritionist  Protonix for GI Px  HEMATOLOGIC A:  No active issues. P:  Trend CBC Heparin for DVT Px  INFECTIOUS A:  HCAP. Empyema. P:   Cultures and antibiotics as above Zosyn used in stead of Cefepime for anaerobic coverage PCT  ENDOCRINE  A:  DM. Hyperglycemia. P:   Insulin gtt  NEUROLOGIC A:  Acute encephalopathy. P:   Goal RASS 0 to -1 Daily WUA Fentanyl / Versed gtt  I have personally obtained history, examined patient, evaluated and interpreted laboratory and imaging results, reviewed medical records, formulated assessment / plan and placed orders.  CRITICAL CARE:  The patient is critically ill with multiple organ systems failure and requires high complexity decision making for assessment and support, frequent evaluation and titration of therapies, application of advanced monitoring  technologies and extensive interpretation of multiple databases. Critical Care Time devoted to patient care services described in this note is 60 minutes.   Lonia Farber, MD Pulmonary and Critical Care Medicine [facility] Pager: [phone]  01/23/2013, 9:46 PM

## 2013-01-23 NOTE — Progress Notes (Addendum)
ANTIBIOTIC CONSULT NOTE - INITIAL  Pharmacy Consult for Vancomycin and Cefepime>>changing to Zosyn Indication: rule out pneumonia  No Known Allergies  Patient Measurements: Height: 6\' 1"  (185.4 cm) Weight: 245 lb (111.131 kg) IBW/kg (Calculated) : 79.9 Adjusted Body Weight: 90 kg  Vital Signs: Temp: 103 F (39.4 C) (12/25 1351) Temp src: Oral (12/25 1351) BP: 154/96 mmHg (12/25 1353) Pulse Rate: 160 (12/25 1418) Intake/Output from previous day:   Intake/Output from this shift:    Labs: No results found for this basename: WBC, HGB, PLT, LABCREA, CREATININE,  in the last 72 hours Estimated Creatinine Clearance: 144.4 ml/min (by C-G formula based on Cr of 0.8). No results found for this basename: VANCOTROUGH, VANCOPEAK, VANCORANDOM, GENTTROUGH, GENTPEAK, GENTRANDOM, TOBRATROUGH, TOBRAPEAK, TOBRARND, AMIKACINPEAK, AMIKACINTROU, AMIKACIN,  in the last 72 hours   Microbiology: No results found for this or any previous visit (from the past 720 hour(s)).  Medical History: Past Medical History  Diagnosis Date  . Diabetes mellitus without complication     Medications:  Awaiting electronic med rec  Assessment: 52 y.o. male presents to [facility] ED with respiratory distress. Pt requiring bipap. To begin broad spectrum antibiotics for r/o PNA. Bld cx ordered. SCr 0.5, est CrCl > 100 ml/min. Wbc wnl. LA 1.74  Goal of Therapy:  Vancomycin trough level 15-20 mcg/ml  Plan:  1. Cefepime 1gm IV q8h.  2. Vancomycin 2 gm IV now then 1gm IV q8h. 3. Will f/u micro data, pt's clinical condition, trough at Css  Christoper Fabian, PharmD, BCPS Clinical pharmacist, pager [phone] 01/23/2013,2:21 PM  Pharmacy has been consulted to change cefepime to zosyn since patient's CT angio is positive for empyema. Will dose zosyn 3.375g IV q8 (4 hour infusion).  Louie Casa, PharmD, BCPS 01/23/2013, 5:59 PM

## 2013-01-24 ENCOUNTER — Inpatient Hospital Stay (HOSPITAL_COMMUNITY): Payer: 59

## 2013-01-24 DIAGNOSIS — R0609 Other forms of dyspnea: Secondary | ICD-10-CM

## 2013-01-24 DIAGNOSIS — J869 Pyothorax without fistula: Secondary | ICD-10-CM

## 2013-01-24 DIAGNOSIS — E119 Type 2 diabetes mellitus without complications: Secondary | ICD-10-CM

## 2013-01-24 DIAGNOSIS — J9600 Acute respiratory failure, unspecified whether with hypoxia or hypercapnia: Secondary | ICD-10-CM

## 2013-01-24 DIAGNOSIS — M7989 Other specified soft tissue disorders: Secondary | ICD-10-CM

## 2013-01-24 DIAGNOSIS — I369 Nonrheumatic tricuspid valve disorder, unspecified: Secondary | ICD-10-CM

## 2013-01-24 DIAGNOSIS — J189 Pneumonia, unspecified organism: Secondary | ICD-10-CM

## 2013-01-24 DIAGNOSIS — J90 Pleural effusion, not elsewhere classified: Secondary | ICD-10-CM

## 2013-01-24 LAB — LACTIC ACID, PLASMA
Lactic Acid, Venous: 1.5 mmol/L (ref 0.5–2.2)
Lactic Acid, Venous: 1.4 mmol/L (ref 0.5–2.2)

## 2013-01-24 LAB — GLUCOSE, CAPILLARY
Glucose-Capillary: 133 mg/dL — ABNORMAL HIGH (ref 70–99)
Glucose-Capillary: 208 mg/dL — ABNORMAL HIGH (ref 70–99)
Glucose-Capillary: 256 mg/dL — ABNORMAL HIGH (ref 70–99)
Glucose-Capillary: 340 mg/dL — ABNORMAL HIGH (ref 70–99)
Glucose-Capillary: 343 mg/dL — ABNORMAL HIGH (ref 70–99)
Glucose-Capillary: 355 mg/dL — ABNORMAL HIGH (ref 70–99)
Glucose-Capillary: 138 mg/dL — ABNORMAL HIGH (ref 70–99)
Glucose-Capillary: 220 mg/dL — ABNORMAL HIGH (ref 70–99)

## 2013-01-24 LAB — POCT I-STAT 3, ART BLOOD GAS (G3+)
Bicarbonate: 31.1 mEq/L — ABNORMAL HIGH (ref 20.0–24.0)
O2 Saturation: 93 %
pCO2 arterial: 57 mmHg — ABNORMAL HIGH (ref 35.0–45.0)
pH, Arterial: 7.347 — ABNORMAL LOW (ref 7.350–7.450)

## 2013-01-24 LAB — BASIC METABOLIC PANEL
CO2: 31 mEq/L (ref 19–32)
Calcium: 8.3 mg/dL — ABNORMAL LOW (ref 8.4–10.5)
Creatinine, Ser: 0.62 mg/dL (ref 0.50–1.35)
Glucose, Bld: 179 mg/dL — ABNORMAL HIGH (ref 70–99)
Potassium: 3.3 mEq/L — ABNORMAL LOW (ref 3.5–5.1)

## 2013-01-24 LAB — PROCALCITONIN: Procalcitonin: 21.56 ng/mL

## 2013-01-24 LAB — CBC
HCT: 34.5 % — ABNORMAL LOW (ref 39.0–52.0)
Hemoglobin: 10.9 g/dL — ABNORMAL LOW (ref 13.0–17.0)
MCH: 26.6 pg (ref 26.0–34.0)
MCV: 84.1 fL (ref 78.0–100.0)
RBC: 4.1 MIL/uL — ABNORMAL LOW (ref 4.22–5.81)
WBC: 8.7 10*3/uL (ref 4.0–10.5)

## 2013-01-24 LAB — MRSA PCR SCREENING: MRSA by PCR: NEGATIVE

## 2013-01-24 LAB — LEGIONELLA ANTIGEN, URINE

## 2013-01-24 LAB — TROPONIN I: Troponin I: 0.41 ng/mL (ref <0.30)

## 2013-01-24 MED ORDER — DEXTROSE 5 % IV SOLN: 1.0000 g | Freq: Three times a day (TID) | INTRAVENOUS | Status: DC

## 2013-01-24 MED ORDER — VANCOMYCIN HCL IN DEXTROSE 1-5 GM/200ML-% IV SOLN: 1000.0000 mg | Freq: Three times a day (TID) | INTRAVENOUS | Status: DC

## 2013-01-24 MED ORDER — DEXTROSE-NACL 5-0.9 % IV SOLN
INTRAVENOUS | Status: DC
  Administered 2013-01-24: 06:00:00 via INTRAVENOUS

## 2013-01-24 MED ORDER — POTASSIUM CHLORIDE 20 MEQ/15ML (10%) PO LIQD
20.0000 meq | ORAL | Status: AC
  Administered 2013-01-24 (×2): 20 meq
  Filled 2013-01-24 (×2): qty 15

## 2013-01-24 MED ORDER — SODIUM CHLORIDE 0.9 % IV BOLUS (SEPSIS)
1000.0000 mL | Freq: Once | INTRAVENOUS | Status: AC
  Administered 2013-01-24: 1000 mL via INTRAVENOUS

## 2013-01-24 MED ORDER — INSULIN ASPART 100 UNIT/ML ~~LOC~~ SOLN
0.0000 [IU] | SUBCUTANEOUS | Status: DC
  Administered 2013-01-24 (×2): 5 [IU] via SUBCUTANEOUS
  Administered 2013-01-24: 3 [IU] via SUBCUTANEOUS
  Administered 2013-01-24: 8 [IU] via SUBCUTANEOUS
  Administered 2013-01-25 (×2): 3 [IU] via SUBCUTANEOUS
  Administered 2013-01-25: 8 [IU] via SUBCUTANEOUS
  Administered 2013-01-25: 5 [IU] via SUBCUTANEOUS
  Administered 2013-01-25: 3 [IU] via SUBCUTANEOUS
  Administered 2013-01-25: 8 [IU] via SUBCUTANEOUS
  Administered 2013-01-26 (×3): 2 [IU] via SUBCUTANEOUS

## 2013-01-24 MED ORDER — INSULIN GLARGINE 100 UNIT/ML ~~LOC~~ SOLN
10.0000 [IU] | Freq: Every day | SUBCUTANEOUS | Status: DC
  Administered 2013-01-24: 10 [IU] via SUBCUTANEOUS
  Filled 2013-01-24 (×2): qty 0.1

## 2013-01-24 MED ORDER — VITAL HIGH PROTEIN PO LIQD
1000.0000 mL | ORAL | Status: DC
  Filled 2013-01-24 (×6): qty 1000

## 2013-01-24 NOTE — Progress Notes (Signed)
  Echocardiogram 2D Echocardiogram has been performed.  RAAHIL, ONG 01/24/2013, 2:55 PM

## 2013-01-24 NOTE — Progress Notes (Signed)
[facility] ADULT ICU REPLACEMENT PROTOCOL FOR AM LAB REPLACEMENT ONLY  The patient does apply for the [facility] Adult ICU Electrolyte Replacment Protocol based on the criteria listed below:   1. Is GFR >/= 40 ml/min? yes  Patient's GFR today is >90 2. Is urine output >/= 0.5 ml/kg/hr for the last 6 hours? yes Patient's UOP is 1.19 ml/kg/hr 3. Is BUN < 60 mg/dL? yes  Patient's BUN today is 11 4. Abnormal electrolyte(s): K+ 3.3 5. Ordered repletion with: see orders 6. If a panic level lab has been reported, has the CCM MD in charge been notified? yes.   Physician:  Dr. Josefa Half, Trisa Cranor A 01/24/2013 6:57 AM

## 2013-01-24 NOTE — Progress Notes (Signed)
PULMONARY / CRITICAL CARE MEDICINE  Name: Stephen Stanton MRN: [id number] DOB: 23-Jun-1960    ADMISSION DATE:  01/23/2013 CONSULTATION DATE:  01/23/2013  REFERRING MD :  HP EDP PRIMARY SERVICE:  PCCM  CHIEF COMPLAINT:  Shortness of breath  BRIEF PATIENT DESCRIPTION: 52 yo with past medical history of new onset diabetes ( brief admission one week ago) brought to [facility] ED with with dyspnea and fever.  In ED required BiPAP. Chest imaging demonstrated bilateral pneumonia and likely empyema on the R.  Transferred [facility] for further management.  SIGNIFICANT EVENTS / STUDIES:  12/25  Presented to [facility] ED with respiratory distress and fever 12/25  CTA chest: No central PE, extensive bilateral pneumonia R>L, large empyema with air fluid level on the right 12/25  Case discussed with TCTS >>> Chest tube by PCCM, TCTS will evaluate in am 12/25  Transferred to [facility] 12/25  Intubated, CVL placed, R tube thoracotomy  12/26  TTE >>> 12/26  Venous Doppler >>>  LINES / TUBES: OETT 12/25 >>> OGT 12/25 >>> R IJ TLC 12/25 >>> Foley 12/25 >>> R chest tube 12/25 >>>  CULTURES: 12/25 MRSA >>> neg 12/25 Blood >>> 12/25 Urine >>> 12/25 Respiratory >>> 12/25 Pleural fluid >>> GPC in pairs / GNB >>>  ANTIBIOTICS: Cefepime 12/25 x 1 Zosyn 12/25 >>> Vancomycin 12/25 >>>  INTERVAL HISTORY:    VITAL SIGNS: Temp:  [97.6 F (36.4 C)-103.6 F (39.8 C)] 100.2 F (37.9 C) (12/26 1100) Pulse Rate:  [77-160] 99 (12/26 1100) Resp:  [0-51] 22 (12/26 1100) BP: (63-169)/(42-116) 86/54 mmHg (12/26 1100) SpO2:  [80 %-100 %] 95 % (12/26 1100) FiO2 (%):  [50 %-100 %] 50 % (12/26 1100) Weight:  [111.131 kg (245 lb)] 111.131 kg (245 lb) (12/25 1351) HEMODYNAMICS: CVP:  [5 mmHg-18 mmHg] 15 mmHg VENTILATOR SETTINGS: Vent Mode:  [-] PRVC FiO2 (%):  [50 %-100 %] 50 % Set Rate:  [22 bmp-28 bmp] 22 bmp Vt Set:  [470 mL-620 mL] 470 mL PEEP:  [5 cmH20-10 cmH20] 10 cmH20 Plateau Pressure:  [24 cmH20-31 cmH20] 28  cmH20 INTAKE / OUTPUT: Intake/Output     12/25 0701 - 12/26 0700 12/26 0701 - 12/27 0700   I.V. (mL/kg) 1455.6 (13.1) 526.8 (4.7)   NG/GT  30   IV Piggyback 500    Total Intake(mL/kg) 1955.6 (17.6) 556.8 (5)   Urine (mL/kg/hr) 2500 165 (0.3)   Chest Tube 1100    Total Output 3600 165   Net -1644.4 +391.8          PHYSICAL EXAMINATION: General:  Appears acutely ill, mechanically ventilated, synchronous Neuro:  Encephalopathic, nonfocal, cough / gag diminished HEENT:  PERRL, OETT / OGT Cardiovascular:  RRR, no m/r/g Lungs:  Bilateral rhonchi, purulent drainage via chest tube Abdomen:  Soft, nontender, bowel sounds diminished Musculoskeletal:  Moves all extremities, bilateral LE edema Skin:  Intact  LABS: CBC  Recent Labs Lab 01/23/13 1410 01/24/13 0445  WBC 6.5 8.7  HGB 12.4* 10.9*  HCT 38.5* 34.5*  PLT 248 201   Coag's  Recent Labs Lab 01/23/13 1830  APTT 21*  INR 1.23   BMET  Recent Labs Lab 01/23/13 1410 01/23/13 1830 01/24/13 0445  NA 127* 130* 135  K 3.7 3.9 3.3*  CL 85* 88* 94*  CO2 23 28 31   BUN 10 11 11   CREATININE 0.50 0.65 0.62  GLUCOSE 366* 393* 179*   Electrolytes  Recent Labs Lab 01/23/13 1410 01/23/13 1830 01/24/13 0445  CALCIUM 9.2 8.3* 8.3*  MG  --   --  1.7  PHOS  --   --  3.7   Sepsis Markers  Recent Labs Lab 01/23/13 1431 01/23/13 1830 01/23/13 2152 01/24/13 0445  LATICACIDVEN 1.74 3.4*  --   --   PROCALCITON  --   --  18.36 21.56   ABG  Recent Labs Lab 01/23/13 2143 01/23/13 2256 01/24/13 0347  PHART 7.525* 7.471* 7.347*  PCO2ART 33.7* 38.8 57.0*  PO2ART 139.0* 59.0* 75.0*   Liver Enzymes  Recent Labs Lab 01/23/13 1410 01/23/13 1830  AST 52* 55*  ALT 52 52  ALKPHOS 97 88  BILITOT 0.4 0.6  ALBUMIN 2.1* 1.7*   Cardiac Enzymes  Recent Labs Lab 01/23/13 1410 01/23/13 1749 01/23/13 2220 01/24/13 0445  TROPONINI 0.44* 0.50* 0.42* 0.41*  PROBNP 488.1* 1140.0*  --   --    Glucose  Recent  Labs Lab 01/24/13 0200 01/24/13 0307 01/24/13 0441 01/24/13 0557 01/24/13 0654 01/24/13 0743  GLUCAP 256* 208* 167* 133* 138* 180*   CXR:  12/26 >>> Hardware in good position, bilateral airspace disease somewhat improved  ASSESSMENT / PLAN:  PULMONARY A:  Bilateral pneumonia (R>L) / HCAP (recent hospitalization). Empyema. Acute hypoxemic respiratory failure. P: Goal SpO2>92, pH>7.30 Full mechanical support Daily SBT Ventilator bundle Trend ABG / CXR TCTS input appreciated >>> conservative management for now  CARDIOVASCULAR A: Sepsis. Unknown LV function + pedal edema + elevated BNP. P:  Goal MAP>60 Levophed gtt, titrate to off Trend lactate TTE Venous Doppler LE  RENAL A:  Hypokalemia. CVP 6. P:   Goal CVP 10-12 Trend BMP D/c maintenance IVF NS 1000 x 1 K 20 x 2 by eMD  GASTROINTESTINAL A:  No active issues. P:   NPO as intubated TF per Nutritionist  Protonix for GI Px  HEMATOLOGIC A:  No active issues. P:  Trend CBC Heparin for DVT Px  INFECTIOUS A:  HCAP. Empyema. P:   Cultures and antibiotics as above  ENDOCRINE  A:  DM. Hyperglycemia. P:   Insulin gtt d/c'd SSI Lantus 10  NEUROLOGIC A:  Acute encephalopathy. P:   Goal RASS 0 to -1 Daily WUA Fentanyl gtt Versed PRN  I have personally obtained history, examined patient, evaluated and interpreted laboratory and imaging results, reviewed medical records, formulated assessment / plan and placed orders.  CRITICAL CARE:  The patient is critically ill with multiple organ systems failure and requires high complexity decision making for assessment and support, frequent evaluation and titration of therapies, application of advanced monitoring technologies and extensive interpretation of multiple databases. Critical Care Time devoted to patient care services described in this note is 35 minutes.   Lonia Farber, MD Pulmonary and Critical Care Medicine [facility] Pager:  [phone]  01/24/2013, 11:35 AM

## 2013-01-24 NOTE — Progress Notes (Signed)
Utilization review completed. Rozalyn Osland, RN, BSN. 

## 2013-01-24 NOTE — Progress Notes (Signed)
INITIAL NUTRITION ASSESSMENT  DOCUMENTATION CODES Per approved criteria  -Obesity Unspecified   INTERVENTION: Initiate Vital High Protein at 20 ml/hr and increase by 10 ml every 4 hours to goal rate of 80 ml/hr. Goal regimen will provide: 1920 kcal, 169 grams protein, 1605 ml free water. RD to continue to follow nutrition care plan.  NUTRITION DIAGNOSIS: Inadequate oral intake related to inability to eat as evidenced by NPO status.   Goal: Enteral nutrition to provide 60-70% of estimated calorie needs (22-25 kcals/kg ideal body weight) and 100% of estimated protein needs, based on ASPEN guidelines for permissive underfeeding in critically ill obese individuals.  Monitor:  weight trends, lab trends, I/O's, TF initiation/tolerance, vent status and settings  Reason for Assessment: MD Consult for TF Initiation  52 y.o. male  Admitting Dx:   ASSESSMENT: PMHx significant for new onset DM. Admitted with dyspnea and fever. Work-up reveals bilateral PNA and R pleural effusion with R chest tube.  Cardiothoracic surgeon saw pt on 12/26 - per note, no need for VATS at this time.  RD consulted for TF Initiation/Management via Adult Tube Feeding protocol; pt is currently ordered for Vital AF 1.2.  Patient is currently intubated on ventilator support.  MV: 10.6 L/min Temp (24hrs), Avg:101.2 F (38.4 C), Min:97.6 F (36.4 C), Max:103.6 F (39.8 C) Pt is now afebrile.  Propofol: none  Pt appears well-nourished. No family at bedside. Noted weight in November of 265 lb and pt is now down to 245 lb. Per H&P, pt was very recently dx with DM, question if weight loss is related to this?  Discussed initiation of enteral nutrition with RN. She notes that there was difficulty getting patient's OGT in but it is now in and ready to use.  Per radiology, orogastric tube tip is in side port in stomach.   Potassium is currently low at 3.3. Magnesium and phosphorus are WNL. Sodium is now WNL. Blood  sugars: 133, 138, 180  Height: Ht Readings from Last 1 Encounters:  01/23/13 6' (1.829 m)    Weight: Wt Readings from Last 1 Encounters:  01/23/13 245 lb (111.131 kg)    Ideal Body Weight: 178 lb/80.9 kg  % Ideal Body Weight: 138%  Wt Readings from Last 10 Encounters:  01/23/13 245 lb (111.131 kg)  12/27/12 265 lb (120.203 kg)    Usual Body Weight: n/a  % Usual Body Weight: n/a  BMI:  Body mass index is 33.22 kg/(m^2). Obese Class I  Estimated Nutritional Needs: Kcal: 2461 kcal Permissive underfeeding goal: 1780 - 2022 kcal Protein: at least 162 grams protein Fluid: approx 2 liters daily  Skin: chest incision  Diet Order: NPO  EDUCATION NEEDS: -No education needs identified at this time   Intake/Output Summary (Last 24 hours) at 01/24/13 0846 Last data filed at 01/24/13 0800  Gross per 24 hour  Intake 2200.39 ml  Output   3645 ml  Net -1444.61 ml    Last BM: PTA  Labs:   Recent Labs Lab 01/23/13 1410 01/23/13 1830 01/24/13 0445  NA 127* 130* 135  K 3.7 3.9 3.3*  CL 85* 88* 94*  CO2 23 28 31   BUN 10 11 11   CREATININE 0.50 0.65 0.62  CALCIUM 9.2 8.3* 8.3*  MG  --   --  1.7  PHOS  --   --  3.7  GLUCOSE 366* 393* 179*    CBG (last 3)   Recent Labs  01/24/13 0557 01/24/13 0654 01/24/13 0743  GLUCAP 133* 138* 180*  No results found for this basename: HGBA1C    Scheduled Meds: . acetaminophen  650 mg Rectal Once  . antiseptic oral rinse  15 mL Mouth Rinse QID  . chlorhexidine  15 mL Mouth Rinse BID  . feeding supplement (VITAL AF 1.2 CAL)  1,000 mL Per Tube Q24H  . heparin subcutaneous  5,000 Units Subcutaneous Q8H  . insulin aspart  0-15 Units Subcutaneous Q4H  . insulin glargine  10 Units Subcutaneous Daily  . pantoprazole (PROTONIX) IV  40 mg Intravenous Q24H  . piperacillin-tazobactam (ZOSYN)  IV  3.375 g Intravenous Q8H  . potassium chloride  20 mEq Per Tube Q4H  . vancomycin  1,000 mg Intravenous Q8H    Continuous  Infusions: . dextrose 5 % and 0.9% NaCl 50 mL/hr at 01/24/13 0800  . fentaNYL infusion INTRAVENOUS 350 mcg/hr (01/24/13 0800)  . norepinephrine (LEVOPHED) Adult infusion 15 mcg/min (01/24/13 0815)    Past Medical History  Diagnosis Date  . Diabetes mellitus without complication     History reviewed. No pertinent past surgical history.  Jarold Motto MS, RD, LDN Pager: [phone] After-hours pager: [phone]

## 2013-01-24 NOTE — ED Provider Notes (Deleted)
Medical screening examination/treatment/procedure(s) were performed by non-physician practitioner and as supervising physician I was immediately available for consultation/collaboration.  EKG Interpretation    Date/Time:  Thursday January 23 2013 14:04:07 EST Ventricular Rate:  142 PR Interval:  180 QRS Duration: 80 QT Interval:  354 QTC Calculation: 544 R Axis:   -23 Text Interpretation:  ;  Abnormal ECG Confirmed by Tishara Pizano  DO, Zachariah Pavek ([id number]) on 01/23/2013 2:22:20 PM              Layla Maw Kassidy Dockendorf, DO 01/24/13 8119

## 2013-01-24 NOTE — Progress Notes (Signed)
VASCULAR LAB PRELIMINARY  PRELIMINARY  PRELIMINARY  PRELIMINARY  Bilateral lower extremity venous duplex completed.    Preliminary report:  Bilateral:  No evidence of DVT, superficial thrombosis, or Baker's Cyst. Rouleaux flow throughout the thigh bilaterally.   Midori Dado, RVS 01/24/2013, 2:04 PM

## 2013-01-24 NOTE — Progress Notes (Signed)
Patient ID: Stephen Stanton, male   DOB: 1960/10/02, 52 y.o.   MRN: [id number]      [address]       [place] [address]             [phone]        [facility] Medical Record #[id number] Date of Birth: 01/20/1961  Referring: Dr Tyson Alias Primary Care: No PCP Per Patient  Chief Complaint:    Chief Complaint  Patient presents with  . Respiratory Distress    History of Present Illness:     Stephen Stanton is a 52 y.o. male with a hx of new onset diabetes (diagnosed one week ago at a hospital in [place] with brief admission) presents to the Emergency Department complaining of acute onset shortness of breath and fever to 103.  History from the wife. She reports at 60 AM patient had an acute onset of shortness of breath and fever. She reports it progressed very quickly. Patient returned from [facility] yesterday in the car. Wife reports swelling of his legs which she noticed last night which seemed worse this morning. She denies any further known medical history. He was given glipizide for his diabetes and no other medications. She denies cardiac history, high cholesterol, hypertension, or history of MI.   Patient was seen in [facility] ER 11/28 for rt pleuritic chest pain, ct of chest was done and that time and compared to one done yesterday. Respiratory distress progressed after  presentation to ER, was intubated and rt chest tube was placed for rt pleural effusion, 1000 ml of fluid obtained culture negative so far no gram stain or cell count or other studies of fluid resulted.   Current Activity/ Functional Status: Patient is independent with mobility/ambulation, transfers, ADL's, IADL's.   Zubrod Score: At the time of surgery this patient's most appropriate activity status/level should be described as: []  Normal activity, no symptoms [x]  Symptoms, fully ambulatory []  Symptoms, in bed less than or equal to 50% of the time []  Symptoms, in bed greater than  50% of the time but less than 100% []  Bedridden []  Moribund  Past Medical History  Diagnosis Date  . Diabetes mellitus without complication     History reviewed. No pertinent past surgical history.  History  Smoking status  . Former Smoker  Smokeless tobacco  . Not on file    History  Alcohol Use No    History   Social History  . Marital Status: Married    Spouse Name: N/A    Number of Children: N/A  . Years of Education: N/A   Occupational History  . Not on file.   Social History Main Topics  . Smoking status: Former Games developer  . Smokeless tobacco: Not on file  . Alcohol Use: No  . Drug Use: No  . Sexual Activity: Not on file   Other Topics Concern  . Not on file   Social History Narrative  . No narrative on file    No Known Allergies  Current Facility-Administered Medications  Medication Dose Route Frequency Provider Last Rate Last Dose  . 0.9 %  sodium chloride infusion  250 mL Intravenous PRN Nelda Bucks, MD      . acetaminophen (TYLENOL) solution 650 mg  650 mg Per Tube Q4H PRN Nelda Bucks, MD      . acetaminophen (TYLENOL) suppository 650 mg  650 mg Rectal Once Kristen N Ward, DO      . antiseptic  oral rinse (BIOTENE) solution 15 mL  15 mL Mouth Rinse QID Nelda Bucks, MD   15 mL at 01/24/13 0311  . chlorhexidine (PERIDEX) 0.12 % solution 15 mL  15 mL Mouth Rinse BID Nelda Bucks, MD   15 mL at 01/24/13 0733  . dextrose 5 %-0.9 % sodium chloride infusion   Intravenous Continuous Nelda Bucks, MD 50 mL/hr at 01/24/13 [phone]    . feeding supplement (VITAL AF 1.2 CAL) liquid 1,000 mL  1,000 mL Per Tube Q24H Nelda Bucks, MD      . fentaNYL (SUBLIMAZE) 10 mcg/mL in sodium chloride 0.9 % 250 mL infusion  0-400 mcg/hr Intravenous Continuous Nelda Bucks, MD 35 mL/hr at 01/24/13 0158 350 mcg/hr at 01/24/13 0158  . fentaNYL (SUBLIMAZE) bolus via infusion 25-50 mcg  25-50 mcg Intravenous Q1H PRN Nelda Bucks, MD        . heparin injection 5,000 Units  5,000 Units Subcutaneous Q8H Nelda Bucks, MD   5,000 Units at 01/24/13 0559  . insulin aspart (novoLOG) injection 0-15 Units  0-15 Units Subcutaneous Q4H Nelda Bucks, MD   3 Units at 01/24/13 [phone]  . insulin glargine (LANTUS) injection 10 Units  10 Units Subcutaneous Daily Nelda Bucks, MD   10 Units at 01/24/13 [phone]  . midazolam (VERSED) injection 2 mg  2 mg Intravenous Q2H PRN Nelda Bucks, MD   2 mg at 01/23/13 1956  . norepinephrine (LEVOPHED) 16 mg in dextrose 5 % 250 mL infusion  5-50 mcg/min Intravenous Titrated Nelda Bucks, MD 18.8 mL/hr at 01/24/13 0745 20 mcg/min at 01/24/13 0745  . pantoprazole (PROTONIX) injection 40 mg  40 mg Intravenous Q24H Nelda Bucks, MD   40 mg at 01/23/13 2113  . piperacillin-tazobactam (ZOSYN) IVPB 3.375 g  3.375 g Intravenous Q8H Hessie Diener [place], RPH   3.375 g at 01/24/13 8119  . potassium chloride 20 MEQ/15ML (10%) liquid 20 mEq  20 mEq Per Tube Q4H Nelda Bucks, MD   20 mEq at 01/24/13 0748  . sodium chloride 0.9 % bolus 1,000 mL  1,000 mL Intravenous PRN Nelda Bucks, MD      . vancomycin (VANCOCIN) IVPB 1000 mg/200 mL premix  1,000 mg Intravenous Q8H Hessie Diener [place], RPH   1,000 mg at 01/24/13 0600    Prescriptions prior to admission  Medication Sig Dispense Refill  . glyBURIDE (DIABETA) 2.5 MG tablet Take 2.5 mg by mouth daily with breakfast.      . ibuprofen (ADVIL,MOTRIN) 800 MG tablet Take 1 tablet (800 mg total) by mouth 3 (three) times daily.  21 tablet  0    History reviewed. No pertinent family history.   Review of Systems:     Cardiac Review of Systems: Y or N  Chest Pain [  y  ]  Resting SOB [  y ] Exertional SOB  [ y ]  Orthopnea [  ]   Pedal Edema [   ]    Palpitations [  ] Syncope  [  ]   Presyncope [   ]  General Review of Systems: [Y] = yes [  ]=no Constitional: recent weight change [  ]; anorexia [  ]; fatigue [place]  ]; nausea [  ]; night  sweats [  ]; fever [ y ]; or chills [  ]  Dental: poor dentition[  ]; Last Dentist visit:   Eye : blurred vision [  ]; diplopia [   ]; vision changes [  ];  Amaurosis fugax[  ]; Resp: cough [  ];  wheezing[  ];  hemoptysis[  ]; shortness of breath[  ]; paroxysmal nocturnal dyspnea[  ]; dyspnea on exertion[  ]; or orthopnea[  ];  GI:  gallstones[  ], vomiting[  ];  dysphagia[  ]; melena[  ];  hematochezia [  ]; heartburn[  ];   Hx of  Colonoscopy[  ]; GU: kidney stones [  ]; hematuria[  ];   dysuria [  ];  nocturia[  ];  history of     obstruction [  ]; urinary frequency [  ]             Skin: rash, swelling[  ];, hair loss[  ];  peripheral edema[  ];  or itching[  ]; Musculosketetal: myalgias[  ];  joint swelling[  ];  joint erythema[  ];  joint pain[  ];  back pain[  ];  Heme/Lymph: bruising[  ];  bleeding[  ];  anemia[  ];  Neuro: TIA[  ];  headaches[  ];  stroke[  ];  vertigo[  ];  seizures[  ];   paresthesias[  ];  difficulty walking[  ];  Psych:depression[  ]; anxiety[  ];  Endocrine: diabetes[ new dx ];  thyroid dysfunction[  ];  Immunizations: Flu [ ? ]; Pneumococcal[  ?];  Other:  Physical Exam: BP 127/80  Pulse 85  Temp(Src) 98.7 F (37.1 C) (Other (Comment))  Resp 22  Ht 6' (1.829 m)  Wt 245 lb (111.131 kg)  BMI 33.22 kg/m2  SpO2 93%  General appearance: cooperative, mild distress and sedated on vent, but follows commands Neurologic: intact Heart: regular rate and rhythm, S1, S2 normal, no murmur, click, rub or gallop Lungs: diminished breath sounds bilaterally Abdomen: soft, non-tender; bowel sounds normal; no masses,  no organomegaly Extremities: extremities normal, atraumatic, no cyanosis or edema and Homans sign is negative, no sign of DVT Wound: right chest tube in place with serosangious drainage, about   Diagnostic Studies & Laboratory data:     Recent Radiology Findings:   Ct Angio Chest Pe  W/cm &/or Wo Cm  01/23/2013   CLINICAL DATA:  Fever.  Leg swelling.  Diabetes.  Short of breath.  EXAM: CT ANGIOGRAPHY CHEST WITH CONTRAST  TECHNIQUE: Multidetector CT imaging of the chest was performed using the standard protocol during bolus administration of intravenous contrast. Multiplanar CT image reconstructions including MIPs were obtained to evaluate the vascular anatomy.  CONTRAST:  OMNIPAQUE IOHEXOL 350 MG/ML SOLN  COMPARISON:  Chest x-ray today.  Chest CT 12/27/2012  FINDINGS: The patient was not able to hold still and there is considerable motion on the study. The study is of marginal quality for pulmonary embolism due to motion. No central pulmonary emboli are identified. Small distal emboli could be missed on the study. There are some questionable filling defects the left upper lobe which are attributable to motion.  Extensive infiltrate throughout most of the right lung with air bronchograms consistent with pneumonia. Left lower lobe infiltrate with air bronchograms consistent with pneumonia.  Large loculated right pleural effusion containing an air-fluid level. This is most consistent with empyema. No significant pleural effusion on the left.  Negative for mass or adenopathy.  No acute bony changes.  Review of the MIP images confirms the above findings.  IMPRESSION: Extensive motion limits the ability to detect pulmonary emboli on the study. No large central emboli are identified.  Extensive bilateral pneumonia, right greater than left.  Large empyema with air-fluid level on the right.  The findings were discussed by telephone between Dr. Signa Kell and Dr. Tyson Alias.   Electronically Signed   By: Marlan Palau M.D.   On: 01/23/2013 16:09   Dg Chest Port 1 View  01/24/2013   CLINICAL DATA:  About every endotracheal tube  EXAM: PORTABLE CHEST - 1 VIEW  COMPARISON:  01/23/2013; 12/27/2012; chest CT -12/24/2012  FINDINGS: Grossly unchanged borderline enlarged cardiac silhouette and  mediastinal contours given persistently reduced lung volumes and patient rotation. Interval advancement of enteric tube with side port now projected over the expected location of the gastric fundus. Otherwise, stable positioning of remaining support apparatus. No pneumothorax. No change to minimally improved aeration of the lungs with extensive bilateral mid and lower lung predominant heterogeneous airspace opacities, right greater than left. Small bilateral pleural effusions are unchanged. Unchanged bones.  IMPRESSION: 1. Appropriately positioned support apparatus as above. No pneumothorax. 2. No change to minimally improved aeration of the lungs with persistent extensive bilateral mid and lower lung heterogeneous airspace opacities worrisome for multifocal infection.   Electronically Signed   By: Simonne Come M.D.   On: 01/24/2013 07:38   Dg Chest Port 1 View  01/23/2013   CLINICAL DATA:  Chest tube placement  EXAM: PORTABLE CHEST - 1 VIEW  COMPARISON:  01/23/2013  FINDINGS: Endotracheal tube in good position. Right jugular catheter in the SVC. No pneumothorax.  NG tube tip at the GE junction with the proximal side hole in distal esophagus.  Interval placement of right-sided chest tube in the right lung base. Decreased and right pleural effusion.  Extensive pneumonia in both lung bases again noted and unchanged.  IMPRESSION: Satisfactory central line placement.  Satisfactory right chest tube with decrease in right pleural effusion  Endotracheal tube remains in good position.  Advance NG tube.   Electronically Signed   By: Marlan Palau M.D.   On: 01/23/2013 19:18   Dg Chest Port 1 View  01/23/2013   CLINICAL DATA:  Intubation. Central venous catheter placement. Right lung pneumonia and right side empyema on earlier imaging.  EXAM: PORTABLE CHEST - 1 VIEW 01/23/2013 1809 hr:  COMPARISON:  Portable chest x-ray earlier same date 1402 hr and CTA chest earlier same date.  FINDINGS: Endotracheal tube tip in  satisfactory position projecting approximately 6 cm above the carina. Right jugular central venous catheter tip projects over the upper SVC. No evidence of mediastinal hematoma or pneumothorax. Dense consolidation throughout the right lower lobe, right middle lobe, and much of the right upper lobe. Dense consolidation in the left lower lobe. No new pulmonary parenchymal abnormalities since earlier same date.  IMPRESSION: 1. Endotracheal tube tip in satisfactory position projecting approximately 6 cm above the carina. 2. Right jugular central venous catheter tip projects over the upper SVC. No acute complicating features. 3. Stable severe pneumonia involving much of the right lung and the left lower lobe since earlier in the day. No new abnormalities.   Electronically Signed   By: Hulan Saas M.D.   On: 01/23/2013 18:24   Dg Chest Port 1 View  01/23/2013   CLINICAL DATA:  Fever and shortness of breath  EXAM: PORTABLE CHEST - 1 VIEW  COMPARISON:  December 27, 2012 chest radiograph and chest CT angiogram  FINDINGS: There is a large pleural  effusion occupying much of the right hemithorax. There is consolidation in both lower lobes. There is diffuse interstitial edema. Heart is mildly enlarged with pulmonary venous hypertension. No bony lesions are appreciated. No appreciable pneumothorax.  IMPRESSION: The findings consistent with congestive heart failure. Note that there is a large pleural effusion on the right, occupying much of the right hemithorax. Superimposed pneumonia in the lower lobes cannot be excluded on this study.   Electronically Signed   By: Bretta Bang M.D.   On: 01/23/2013 14:27      Recent Lab Findings: Lab Results  Component Value Date   WBC 8.7 01/24/2013   HGB 10.9* 01/24/2013   HCT 34.5* 01/24/2013   PLT 201 01/24/2013   GLUCOSE 179* 01/24/2013   ALT 52 01/23/2013   AST 55* 01/23/2013   NA 135 01/24/2013   K 3.3* 01/24/2013   CL 94* 01/24/2013   CREATININE 0.62  01/24/2013   BUN 11 01/24/2013   CO2 31 01/24/2013   INR 1.23 01/23/2013      Assessment / Plan:      Bilateral Pneumonia with sepsis and respiratory failures requiring intubation, presumed infectious  RT pleural effusion with right chest tube in place/ culture of pleural fluid pending/ no gram stain reported fluid studies resulted At this pont do not see need for VATS and possible complications from one lung ventilation, fu on cultures and serial radiographs of chest , consider repeat ct of chest depending on chest xray changes.    Delight Ovens MD      [address] [place] [place] [address] Office [phone]   Beeper [phone]  01/24/2013 8:04 AM

## 2013-01-24 NOTE — Progress Notes (Signed)
eLink Physician-Brief Progress Note Patient Name: Eydan Chianese DOB: 10-16-1960 MRN: [id number]  Date of Service  01/24/2013   HPI/Events of Note   Glu better, not bein gfed  eICU Interventions  Transition off drip, add d5 fluids for this, lants   Intervention Category Major Interventions: Acute renal failure - evaluation and management;Hyperglycemia - active titration of insulin therapy  Nelda Bucks. 01/24/2013, 6:13 AM

## 2013-01-25 ENCOUNTER — Inpatient Hospital Stay (HOSPITAL_COMMUNITY): Payer: 59

## 2013-01-25 LAB — BASIC METABOLIC PANEL
CO2: 32 mEq/L (ref 19–32)
Creatinine, Ser: 0.68 mg/dL (ref 0.50–1.35)
Glucose, Bld: 283 mg/dL — ABNORMAL HIGH (ref 70–99)
Potassium: 3.3 mEq/L — ABNORMAL LOW (ref 3.5–5.1)
Sodium: 130 mEq/L — ABNORMAL LOW (ref 135–145)

## 2013-01-25 LAB — GLUCOSE, CAPILLARY
Glucose-Capillary: 218 mg/dL — ABNORMAL HIGH (ref 70–99)
Glucose-Capillary: 252 mg/dL — ABNORMAL HIGH (ref 70–99)
Glucose-Capillary: 266 mg/dL — ABNORMAL HIGH (ref 70–99)

## 2013-01-25 LAB — URINE CULTURE: Colony Count: NO GROWTH

## 2013-01-25 LAB — LACTIC ACID, PLASMA: Lactic Acid, Venous: 1.3 mmol/L (ref 0.5–2.2)

## 2013-01-25 MED ORDER — INSULIN GLARGINE 100 UNIT/ML ~~LOC~~ SOLN
20.0000 [IU] | Freq: Every day | SUBCUTANEOUS | Status: DC
  Administered 2013-01-25 – 2013-01-27 (×3): 20 [IU] via SUBCUTANEOUS
  Filled 2013-01-25 (×5): qty 0.2

## 2013-01-25 MED ORDER — PNEUMOCOCCAL VAC POLYVALENT 25 MCG/0.5ML IJ INJ
0.5000 mL | INJECTION | INTRAMUSCULAR | Status: AC
  Administered 2013-01-26: 0.5 mL via INTRAMUSCULAR
  Filled 2013-01-25: qty 0.5

## 2013-01-25 MED ORDER — WHITE PETROLATUM GEL
Status: AC
  Administered 2013-01-25: 0.2
  Filled 2013-01-25: qty 5

## 2013-01-25 MED ORDER — VANCOMYCIN HCL 10 G IV SOLR
1500.0000 mg | Freq: Three times a day (TID) | INTRAVENOUS | Status: DC
  Administered 2013-01-25 – 2013-01-27 (×6): 1500 mg via INTRAVENOUS
  Filled 2013-01-25 (×8): qty 1500

## 2013-01-25 MED ORDER — FENTANYL BOLUS VIA INFUSION
50.0000 ug | INTRAVENOUS | Status: DC | PRN
  Filled 2013-01-25: qty 100

## 2013-01-25 MED ORDER — POTASSIUM CHLORIDE 20 MEQ/15ML (10%) PO LIQD
ORAL | Status: AC
  Filled 2013-01-25: qty 30

## 2013-01-25 MED ORDER — POTASSIUM CHLORIDE 20 MEQ/15ML (10%) PO LIQD
40.0000 meq | Freq: Once | ORAL | Status: AC
  Administered 2013-01-25: 40 meq
  Filled 2013-01-25: qty 30

## 2013-01-25 MED ORDER — INFLUENZA VAC SPLIT QUAD 0.5 ML IM SUSP
0.5000 mL | INTRAMUSCULAR | Status: AC
  Administered 2013-01-26: 0.5 mL via INTRAMUSCULAR
  Filled 2013-01-25: qty 0.5

## 2013-01-25 NOTE — Procedures (Signed)
Extubation Procedure Note  Patient Details:   Name: Stephen Stanton DOB: 1960/09/24 MRN: [id number]   Airway Documentation:  Airway 8 mm (Active)  Secured at (cm) 24 cm 01/25/2013  9:11 AM  Measured From Lips 01/25/2013  9:11 AM  Secured Location Left 01/25/2013  9:11 AM  Secured By [employer] 01/25/2013  9:11 AM  Tube Holder Repositioned Yes 01/25/2013  9:11 AM  Cuff Pressure (cm H2O) 24 cm H2O 01/25/2013  9:11 AM  Site Condition Dry 01/25/2013  9:11 AM    Evaluation  O2 sats: stable throughout Complications: No apparent complications Patient did tolerate procedure well. Bilateral Breath Sounds: Clear;Rhonchi (clears some with sx.'ing) Suctioning: Oral;Airway Yes, Pt. able to hold head off bed on own, positive cuff leak, NIF(-20), FVC(900cc), placed on 2lpm n/c prior to extubation, tube feeds on hold X1 hr. Prior, I/S placed @ bedside-plan to instruct asap, no Stridor/distress noted s/p extubation, RN @ bedside, RT to monitor.   Joylene John 01/25/2013, 10:49 AM

## 2013-01-25 NOTE — Progress Notes (Signed)
ANTIBIOTIC CONSULT NOTE - Follow up  Pharmacy Consult for Vancomycin, zosyn Indication: rule out pneumonia  No Known Allergies  Patient Measurements: Height: 6' (182.9 cm) Weight: 251 lb 1 oz (113.881 kg) IBW/kg (Calculated) : 77.6 Adjusted Body Weight: 90 kg  Vital Signs: Temp: 100.2 F (37.9 C) (12/27 1400) BP: 118/78 mmHg (12/27 1600) Pulse Rate: 96 (12/27 1600) Intake/Output from previous day: 12/26 0701 - 12/27 0700 In: 3692 [I.V.:1219.6; NG/GT:722.3; IV Piggyback:1750] Out: 1120 [Urine:1070; Chest Tube:50] Intake/Output from this shift: Total I/O In: 148.1 [I.V.:8.1; NG/GT:90; IV Piggyback:50] Out: 650 [Urine:650]  Labs:  Recent Labs  01/23/13 1410 01/23/13 1830 01/24/13 0445 01/25/13 0405  WBC 6.5  --  8.7  --   HGB 12.4*  --  10.9*  --   PLT 248  --  201  --   CREATININE 0.50 0.65 0.62 0.68   Estimated Creatinine Clearance: 143.9 ml/min (by C-G formula based on Cr of 0.68).  Recent Labs  01/25/13 1457  VANCOTROUGH 9.5*     Assessment: 52 yom on vancomycin and zosyn for pneumonia. 8 hour Vanc trough resulted at 9.5. WBC wnl yesterday, Temp 100.5, CrCl > 100 mL/min.   Goal of Therapy:  Vancomycin trough level 15-20 mcg/ml  Plan:  1. Increase vancomycin to 1500 mg IV q8h for calculated trough ~ 17 2. Continue zosyn 3.375 IV Q 8 hours 3. Will f/u micro data, pt's clinical condition, trough at Css  Vinnie Level, PharmD.  Clinical Pharmacist Pager [phone]

## 2013-01-25 NOTE — Progress Notes (Signed)
Wasted 200 ML of Fentanyl in sink. Izell [place], RN.  Zenovia Jordan, RN

## 2013-01-25 NOTE — Progress Notes (Signed)
PULMONARY / CRITICAL CARE MEDICINE  Name: Stephen Stanton MRN: [id number] DOB: 09-02-1960    ADMISSION DATE:  01/23/2013 CONSULTATION DATE:  01/23/2013  REFERRING MD :  HP EDP PRIMARY SERVICE:  PCCM  CHIEF COMPLAINT:  Shortness of breath  BRIEF PATIENT DESCRIPTION: 52 yo with past medical history of new onset diabetes ( brief admission one week ago) brought to [facility] ED with with dyspnea and fever.  In ED required BiPAP. Chest imaging demonstrated bilateral pneumonia and likely empyema on the R.  Transferred [facility] for further management.  SIGNIFICANT EVENTS / STUDIES:  12/25  Presented to [facility] ED with respiratory distress and fever 12/25  CTA chest: No central PE, extensive bilateral pneumonia R>L, large empyema with air fluid level on the right 12/25  Case discussed with TCTS >>> Chest tube by PCCM, TCTS will evaluate in am 12/25  Transferred to [facility] 12/25  Intubated, CVL placed, R tube thoracotomy  12/26  TTE >>> EF 50-55, no RWMA 12/26  Venous Doppler >>> no DVT  LINES / TUBES: OETT 12/25 >>> OGT 12/25 >>> R IJ TLC 12/25 >>> Foley 12/25 >>> R chest tube 12/25 >>>  CULTURES: 12/25 MRSA >>> neg 12/25 Blood >>> 12/25 Urine >>> 12/25 Respiratory >>> 12/25 Pleural fluid >>> GPC in pairs / GNB >>>  ANTIBIOTICS: Cefepime 12/25 x 1 Zosyn 12/25 >>> Vancomycin 12/25 >>>  INTERVAL HISTORY:  Doing well, awake, following commands. Does not require vasopressors.  VITAL SIGNS: Temp:  [98.4 F (36.9 C)-100.5 F (38.1 C)] 98.8 F (37.1 C) (12/27 0730) Pulse Rate:  [77-116] 87 (12/27 0730) Resp:  [8-38] 22 (12/27 0730) BP: (73-158)/(48-116) 115/76 mmHg (12/27 0700) SpO2:  [90 %-100 %] 100 % (12/27 0730) FiO2 (%):  [50 %] 50 % (12/27 0400) Weight:  [113.881 kg (251 lb 1 oz)] 113.881 kg (251 lb 1 oz) (12/27 0500) HEMODYNAMICS: CVP:  [6 mmHg-19 mmHg] 11 mmHg VENTILATOR SETTINGS: Vent Mode:  [-] PRVC FiO2 (%):  [50 %] 50 % Set Rate:  [22 bmp] 22 bmp Vt Set:  [470 mL] 470 mL PEEP:  [8  cmH20] 8 cmH20 Plateau Pressure:  [23 cmH20-28 cmH20] 25 cmH20 INTAKE / OUTPUT: Intake/Output     12/26 0701 - 12/27 0700 12/27 0701 - 12/28 0700   I.V. (mL/kg) 1219.6 (10.7) 3.3 (0)   NG/GT 722.3    IV Piggyback 1750    Total Intake(mL/kg) 3692 (32.4) 3.3 (0)   Urine (mL/kg/hr) 1070 (0.4)    Chest Tube 50 (0)    Total Output 1120     Net +2572 +3.3          PHYSICAL EXAMINATION: General:  Looks comfortable, no distress Neuro:  Awake, alert, following commands HEENT:  PERRL, OETT / OGT Cardiovascular:  RRR, no m/r/g Lungs:  Bilateral air entry, no added sounds, R chest tube Abdomen:  Soft, nontender, bowel sounds diminished Musculoskeletal:  Moves all extremities, bilateral LE edema Skin:  Intact  LABS: CBC  Recent Labs Lab 01/23/13 1410 01/24/13 0445  WBC 6.5 8.7  HGB 12.4* 10.9*  HCT 38.5* 34.5*  PLT 248 201   Coag's  Recent Labs Lab 01/23/13 1830  APTT 21*  INR 1.23   BMET  Recent Labs Lab 01/23/13 1830 01/24/13 0445 01/25/13 0405  NA 130* 135 130*  K 3.9 3.3* 3.3*  CL 88* 94* 91*  CO2 28 31 32  BUN 11 11 15   CREATININE 0.65 0.62 0.68  GLUCOSE 393* 179* 283*   Electrolytes  Recent Labs  Lab 01/23/13 1830 01/24/13 0445 01/25/13 0405  CALCIUM 8.3* 8.3* 8.3*  MG  --  1.7  --   PHOS  --  3.7  --    Sepsis Markers  Recent Labs Lab 01/23/13 2152 01/24/13 0445 01/24/13 1153 01/24/13 1745 01/25/13 0150 01/25/13 0405  LATICACIDVEN  --   --  1.5 1.4 1.3  --   PROCALCITON 18.36 21.56  --   --   --  14.78   ABG  Recent Labs Lab 01/23/13 2143 01/23/13 2256 01/24/13 0347  PHART 7.525* 7.471* 7.347*  PCO2ART 33.7* 38.8 57.0*  PO2ART 139.0* 59.0* 75.0*   Liver Enzymes  Recent Labs Lab 01/23/13 1410 01/23/13 1830  AST 52* 55*  ALT 52 52  ALKPHOS 97 88  BILITOT 0.4 0.6  ALBUMIN 2.1* 1.7*   Cardiac Enzymes  Recent Labs Lab 01/23/13 1410 01/23/13 1749 01/23/13 2220 01/24/13 0445  TROPONINI 0.44* 0.50* 0.42* 0.41*   PROBNP 488.1* 1140.0*  --   --    Glucose  Recent Labs Lab 01/24/13 1133 01/24/13 1608 01/24/13 1944 01/25/13 0005 01/25/13 0357 01/25/13 0717  GLUCAP 220* 283* 249* 252* 266* 218*   CXR:  12/27 >>> Hardware in good position, bilateral airspace disease unchanged.  ASSESSMENT / PLAN:  PULMONARY A:  Bilateral pneumonia (R>L) / HCAP (recent hospitalization). Empyema. Acute hypoxemic respiratory failure. P: Goal SpO2>92 Wean to extubate TCTS input appreciated >>> conservative management for now  CARDIOVASCULAR A: Sepsis - resolving. Lactate reassuring. Normal LVF. DVT ruled out. P:  Goal MAP>60 D/c Levophed Trend lactate  RENAL A:  Hypokalemia. P:   D/c CVP Trend BMP K 40 x 1  GASTROINTESTINAL A:  No active issues. P:   NPO Hold TF for possible extubation Protonix for GI Px  HEMATOLOGIC A:  Mild anemia. P:  Trend CBC Heparin for DVT Px  INFECTIOUS A:  HCAP. Empyema. P:   Cultures and antibiotics as above  ENDOCRINE  A:  DM. Hyperglycemia. P:   SSI Increase Lantus 20  NEUROLOGIC A:  Acute encephalopathy. P:   Goal RASS 0 to -1 Daily WUA D/c Fentanyl gtt Fentanyl boluses PRN D/cVersed PRN  I have personally obtained history, examined patient, evaluated and interpreted laboratory and imaging results, reviewed medical records, formulated assessment / plan and placed orders.  CRITICAL CARE:  The patient is critically ill with multiple organ systems failure and requires high complexity decision making for assessment and support, frequent evaluation and titration of therapies, application of advanced monitoring technologies and extensive interpretation of multiple databases. Critical Care Time devoted to patient care services described in this note is 35 minutes.   Lonia Farber, MD Pulmonary and Critical Care Medicine [facility] Pager: [phone]  01/25/2013, 8:59 AM

## 2013-01-25 NOTE — Progress Notes (Signed)
eLink Physician-Brief Progress Note Patient Name: Stephen Stanton DOB: [birth year] MRN: [id number]  Date of Service  01/25/2013   HPI/Events of Note     eICU Interventions  Hypokalemia -repleted    Intervention Category Minor Interventions: Electrolytes abnormality - evaluation and management  [name],[name] 01/25/2013, 5:53 AM

## 2013-01-26 ENCOUNTER — Inpatient Hospital Stay (HOSPITAL_COMMUNITY): Payer: 59

## 2013-01-26 LAB — BASIC METABOLIC PANEL
BUN: 13 mg/dL (ref 6–23)
CO2: 32 mEq/L (ref 19–32)
Chloride: 94 mEq/L — ABNORMAL LOW (ref 96–112)
GFR calc Af Amer: >90 mL/min (ref >90)
Potassium: 2.5 mEq/L — CL (ref 3.5–5.1)
Sodium: 134 mEq/L — ABNORMAL LOW (ref 135–145)

## 2013-01-26 LAB — CULTURE, RESPIRATORY W GRAM STAIN

## 2013-01-26 LAB — CBC
Platelets: 171 10*3/uL (ref 150–400)
RBC: 3.85 MIL/uL — ABNORMAL LOW (ref 4.22–5.81)
RDW: 13.5 % (ref 11.5–15.5)
WBC: 11.7 10*3/uL — ABNORMAL HIGH (ref 4.0–10.5)

## 2013-01-26 LAB — GLUCOSE, CAPILLARY
Glucose-Capillary: 122 mg/dL — ABNORMAL HIGH (ref 70–99)
Glucose-Capillary: 142 mg/dL — ABNORMAL HIGH (ref 70–99)
Glucose-Capillary: 151 mg/dL — ABNORMAL HIGH (ref 70–99)
Glucose-Capillary: 166 mg/dL — ABNORMAL HIGH (ref 70–99)
Glucose-Capillary: 108 mg/dL — ABNORMAL HIGH (ref 70–99)

## 2013-01-26 MED ORDER — HYDRALAZINE HCL 20 MG/ML IJ SOLN
20.0000 mg | INTRAMUSCULAR | Status: DC | PRN
  Administered 2013-01-27: 20 mg via INTRAVENOUS
  Filled 2013-01-26: qty 1

## 2013-01-26 MED ORDER — INSULIN ASPART 100 UNIT/ML ~~LOC~~ SOLN
0.0000 [IU] | Freq: Three times a day (TID) | SUBCUTANEOUS | Status: DC
  Administered 2013-01-26 (×2): 3 [IU] via SUBCUTANEOUS
  Administered 2013-01-27: 2 [IU] via SUBCUTANEOUS
  Administered 2013-01-27: 5 [IU] via SUBCUTANEOUS
  Administered 2013-01-27: 2 [IU] via SUBCUTANEOUS

## 2013-01-26 MED ORDER — INSULIN ASPART 100 UNIT/ML ~~LOC~~ SOLN
0.0000 [IU] | Freq: Every day | SUBCUTANEOUS | Status: DC
  Administered 2013-01-27: 2 [IU] via SUBCUTANEOUS

## 2013-01-26 MED ORDER — POTASSIUM CHLORIDE CRYS ER 20 MEQ PO TBCR
40.0000 meq | EXTENDED_RELEASE_TABLET | ORAL | Status: AC
  Administered 2013-01-26 (×2): 40 meq via ORAL
  Filled 2013-01-26 (×2): qty 2

## 2013-01-26 NOTE — Progress Notes (Signed)
Patient ID: Stephen Stanton, male   DOB: 1960/07/18, 52 y.o.   MRN: [id number] TCTS DAILY ICU PROGRESS NOTE                   [address]            [place] [address]          [phone]   Total Length of Stay:  LOS: 3 days   Subjective: Now extubated, awake and alert  Objective: Vital signs in last 24 hours: Temp:  [97.8 F (36.6 C)-99.4 F (37.4 C)] 97.8 F (36.6 C) (12/28 1200) Pulse Rate:  [89-107] 101 (12/28 1800) Cardiac Rhythm:  [-] Normal sinus rhythm (12/28 1600) Resp:  [20-48] 47 (12/28 1800) BP: (92-155)/(58-104) 154/97 mmHg (12/28 1800) SpO2:  [88 %-98 %] 96 % (12/28 1800) Weight:  [248 lb 7.3 oz (112.7 kg)] 248 lb 7.3 oz (112.7 kg) (12/28 0500)  Filed Weights   01/23/13 1351 01/25/13 0500 01/26/13 0500  Weight: 245 lb (111.131 kg) 251 lb 1 oz (113.881 kg) 248 lb 7.3 oz (112.7 kg)    Weight change: -2 lb 9.7 oz (-1.181 kg)   Hemodynamic parameters for last 24 hours: CVP:  [12 mmHg-22 mmHg] 15 mmHg  Intake/Output from previous day: 12/27 0701 - 12/28 0700 In: 1348.1 [P.O.:100; I.V.:8.1; NG/GT:90; IV Piggyback:1150] Out: 1075 [Urine:975; Chest Tube:100]  Intake/Output this shift: Total I/O In: 800 [P.O.:250; IV Piggyback:550] Out: 750 [Urine:750]  Current Meds: Scheduled Meds: . heparin subcutaneous  5,000 Units Subcutaneous Q8H  . insulin aspart  0-15 Units Subcutaneous TID WC  . insulin aspart  0-5 Units Subcutaneous QHS  . insulin glargine  20 Units Subcutaneous Daily  . piperacillin-tazobactam (ZOSYN)  IV  3.375 g Intravenous Q8H  . vancomycin  1,500 mg Intravenous Q8H   Continuous Infusions:  PRN Meds:.sodium chloride, acetaminophen (TYLENOL) oral liquid 160 mg/5 mL, sodium chloride  General appearance: alert, cooperative and no distress Neurologic: intact Heart: regular rate and rhythm, S1, S2 normal, no murmur, click, rub or gallop Lungs: diminished breath sounds bilaterally Abdomen: soft, non-tender; bowel sounds normal;  no masses,  no organomegaly Extremities: extremities normal, atraumatic, no cyanosis or edema and Homans sign is negative, no sign of DVT Wound: chest tube in place with no air leak  Lab Results: CBC:  Recent Labs  01/24/13 0445 01/26/13 0937  WBC 8.7 11.7*  HGB 10.9* 10.3*  HCT 34.5* 32.2*  PLT 201 171   BMET:   Recent Labs  01/25/13 0405 01/26/13 0937  NA 130* 134*  K 3.3* 2.5*  CL 91* 94*  CO2 32 32  GLUCOSE 283* 162*  BUN 15 13  CREATININE 0.68 0.72  CALCIUM 8.3* 8.4    PT/INR: No results found for this basename: LABPROT, INR,  in the last 72 hours Radiology: Ct Chest Wo Contrast  01/26/2013   CLINICAL DATA:  Shortness of breath and fever.  EXAM: CT CHEST WITHOUT CONTRAST  TECHNIQUE: Multidetector CT imaging of the chest was performed following the standard protocol without IV contrast.  COMPARISON:  01/23/2013  FINDINGS: There has been interval placement of a right-sided chest tube. There has been near-complete resolution of previous right empyema. No significant pneumothorax identified. Diffuse bilateral airspace opacities are identified consistent with multifocal pneumonia.  The trachea appears patent and is midline. The heart size appears normal. No pericardial effusion identified. There is no enlarged supraclavicular or axillary adenopathy.  Review of the visualized osseous structures is unremarkable.  Incidental  imaging through the upper abdomen shows no acute findings.  IMPRESSION: 1. Interval placement of right chest tube with near complete resolution of right-sided empyema. 2. Diffuse bilateral airspace opacities consistent with multifocal, bilateral pneumonia.   Electronically Signed   By: Signa Kell M.D.   On: 01/26/2013 16:05   Dg Chest Port 1 View  01/25/2013   CLINICAL DATA:  Assess chest tube placement.  EXAM: PORTABLE CHEST - 1 VIEW  COMPARISON:  January 24, 2013  FINDINGS: The lungs are less well inflated today. There are persistent confluent alveolar  densities occupying approximately 1/2 of the right lung inferiorly with patchy areas of confluent density on the left. The left hemidiaphragm is obscured. The chest tube on the right has its tip projecting inferiorly overlying the posterior aspect of the right 10th rib. This appearance is stable. The cardiopericardial silhouette remains mildly enlarged. The pulmonary vascularity is indistinct.  The endotracheal tube tip lies approximately 5 cm above the crotch of the carina. The right internal jugular venous catheter tip lies at the level of the junction of the right and left brachiocephalic veins. The esophagogastric tube tip and proximal port appear lie below the expected level of the GE junction in the left upper quadrant of the abdomen.  IMPRESSION: 1. There is mild pulmonary hypo inflation today. Persistent confluent alveolar densities are present consistent with pneumonia or ARDS. 2. The support tubes and lines appear to be in appropriate position.   Electronically Signed   By: David  [place]   On: 01/25/2013 09:32     Assessment/Plan: With clinical improvement and marked improvement in pleural space disease on follow up CT of chest agree with continuing current RX and not plan on VATS/Thoracotomy Will not see daily, please call if need further assistance    Ira Dougher B 01/26/2013 6:36 PM

## 2013-01-26 NOTE — Progress Notes (Signed)
eLink Physician-Brief Progress Note Patient Name: Stephen Stanton DOB: 04-Aug-1960 MRN: [id number]  Date of Service  01/26/2013   HPI/Events of Note   freq PVCs,  HTN  eICU Interventions  Give hydralazine prn IV  chk bmet for K level   Intervention Category Major Interventions: Arrhythmia - evaluation and management;Hypertension - evaluation and management  Shan Levans 01/26/2013, 11:56 PM

## 2013-01-26 NOTE — Progress Notes (Signed)
PULMONARY / CRITICAL CARE MEDICINE  Name: Stephen Stanton MRN: [id number] DOB: 11-13-1960    ADMISSION DATE:  01/23/2013 CONSULTATION DATE:  01/23/2013  REFERRING MD :  HP EDP PRIMARY SERVICE:  PCCM  CHIEF COMPLAINT:  Shortness of breath  BRIEF PATIENT DESCRIPTION: 52 yo with past medical history of new onset diabetes ( brief admission one week ago) brought to [facility] ED with with dyspnea and fever.  In ED required BiPAP. Chest imaging demonstrated bilateral pneumonia and likely empyema on the R.  Transferred [facility] for further management.  SIGNIFICANT EVENTS / STUDIES:  12/25  Presented to [facility] ED with respiratory distress and fever 12/25  CTA chest: No central PE, extensive bilateral pneumonia R>L, large empyema with air fluid level on the right 12/25  Case discussed with TCTS >>> Chest tube by PCCM, TCTS will evaluate in am 12/25  Transferred to [facility] 12/25  Intubated, CVL placed, R tube thoracotomy  12/26  TTE >>> EF 50-55, no RWMA 12/26  Venous Doppler >>> no DVT 12/28  Chest CT >>>  LINES / TUBES: OETT 12/25 >>> 12/27 OGT 12/25 >>> 12/27 R IJ TLC 12/25 >>> Foley 12/25 >>> 12/27 R chest tube 12/25 >>>  CULTURES: 12/25 MRSA >>> neg 12/25 Blood >>> 12/25 Urine >>> neg 12/25 Respiratory >>> 12/25 Pleural fluid >>> GPC in pairs / GNB >>>  ANTIBIOTICS: Cefepime 12/25 x 1 Zosyn 12/25 >>> Vancomycin 12/25 >>>  INTERVAL HISTORY:  Feeling better, however still borderline SpO2 on 6 L of oxygen.  VITAL SIGNS: Temp:  [98.1 F (36.7 C)-100.2 F (37.9 C)] 98.1 F (36.7 C) (12/28 0745) Pulse Rate:  [89-106] 95 (12/28 0800) Resp:  [21-40] 36 (12/28 0800) BP: (92-132)/(58-83) 126/83 mmHg (12/28 0800) SpO2:  [88 %-98 %] 95 % (12/28 0800) Weight:  [112.7 kg (248 lb 7.3 oz)] 112.7 kg (248 lb 7.3 oz) (12/28 0500)  HEMODYNAMICS: CVP:  [10 mmHg-22 mmHg] 15 mmHg  VENTILATOR SETTINGS:   INTAKE / OUTPUT: Intake/Output     12/27 0701 - 12/28 0700 12/28 0701 - 12/29 0700   P.O. 100    I.V.  (mL/kg) 8.1 (0.1)    NG/GT 90    IV Piggyback 1150    Total Intake(mL/kg) 1348.1 (12)    Urine (mL/kg/hr) 975 (0.4) 400 (0.9)   Chest Tube 100 (0)    Total Output 1075 400   Net +273.1 -400        Urine Occurrence 1 x    Stool Occurrence 1 x      PHYSICAL EXAMINATION: General:  Looks comfortable, but somewhat tachypneic Neuro:  Awake, alert, following commands HEENT:  PERRL, moist membranes Cardiovascular:  RRR, no m/r/g Lungs:  Bilateral air entry, no added sounds, R chest tube Abdomen:  Soft, nontender, bowel sounds normal Musculoskeletal:  Moves all extremities, bilateral LE edema Skin:  Intact  LABS: CBC  Recent Labs Lab 01/23/13 1410 01/24/13 0445 01/26/13 0937  WBC 6.5 8.7 11.7*  HGB 12.4* 10.9* 10.3*  HCT 38.5* 34.5* 32.2*  PLT 248 201 171   Coag's  Recent Labs Lab 01/23/13 1830  APTT 21*  INR 1.23   BMET  Recent Labs Lab 01/24/13 0445 01/25/13 0405 01/26/13 0937  NA 135 130* 134*  K 3.3* 3.3* 2.5*  CL 94* 91* 94*  CO2 31 32 32  BUN 11 15 13   CREATININE 0.62 0.68 0.72  GLUCOSE 179* 283* 162*   Electrolytes  Recent Labs Lab 01/24/13 0445 01/25/13 0405 01/26/13 0937  CALCIUM 8.3* 8.3*  8.4  MG 1.7  --   --   PHOS 3.7  --   --    Sepsis Markers  Recent Labs Lab 01/23/13 2152 01/24/13 0445 01/24/13 1153 01/24/13 1745 01/25/13 0150 01/25/13 0405  LATICACIDVEN  --   --  1.5 1.4 1.3  --   PROCALCITON 18.36 21.56  --   --   --  14.78   ABG  Recent Labs Lab 01/23/13 2143 01/23/13 2256 01/24/13 0347  PHART 7.525* 7.471* 7.347*  PCO2ART 33.7* 38.8 57.0*  PO2ART 139.0* 59.0* 75.0*   Liver Enzymes  Recent Labs Lab 01/23/13 1410 01/23/13 1830  AST 52* 55*  ALT 52 52  ALKPHOS 97 88  BILITOT 0.4 0.6  ALBUMIN 2.1* 1.7*   Cardiac Enzymes  Recent Labs Lab 01/23/13 1410 01/23/13 1749 01/23/13 2220 01/24/13 0445  TROPONINI 0.44* 0.50* 0.42* 0.41*  PROBNP 488.1* 1140.0*  --   --    Glucose  Recent Labs Lab  01/25/13 1108 01/25/13 1521 01/25/13 1926 01/25/13 2354 01/26/13 0358 01/26/13 0720  GLUCAP 197* 181* 154* 145* 122* 142*   CXR:  None today  ASSESSMENT / PLAN:  PULMONARY A:  Bilateral pneumonia (R>L) / HCAP (recent hospitalization). Empyema. Acute hypoxemic respiratory failure. P: Goal SpO2>92 Supplemental oxygen PRN TCTS input appreciated >>> conservative management for now Repeat CT chest to reevaluate pleural space At risk for reintubation  CARDIOVASCULAR A: Sepsis - resolving. Lactate reassuring. Normal LVF. DVT ruled out. P:  Goal MAP>60  RENAL A:  Hypokalemia. P:   Trend BMP  GASTROINTESTINAL A:  No active issues. P:   Diet D/c Protonix for GI Px  HEMATOLOGIC A:  Mild anemia. P:  Trend CBC Heparin for DVT Px  INFECTIOUS A:  HCAP. Empyema. P:   Cultures and antibiotics as above  ENDOCRINE  A:  DM. Hyperglycemia. P:   SSI change hc&hs Lantus 20  NEUROLOGIC A:  Acute encephalopathy, resolved. P:   No interventions required  Recently diagnosed DM. Bilateral pneumonia. Polymicrobial empyema (GPC+GNR). Tube thoracotomy by PCCM. TCTS involved, May consider VATS based on repeat CT scan results.  I have personally obtained history, examined patient, evaluated and interpreted laboratory and imaging results, reviewed medical records, formulated assessment / plan and placed orders.  CRITICAL CARE:  The patient is critically ill with multiple organ systems failure and requires high complexity decision making for assessment and support, frequent evaluation and titration of therapies, application of advanced monitoring technologies and extensive interpretation of multiple databases. Critical Care Time devoted to patient care services described in this note is 35 minutes.   Lonia Farber, MD Pulmonary and Critical Care Medicine [facility] Pager: [phone]  01/26/2013, 10:49 AM

## 2013-01-27 ENCOUNTER — Inpatient Hospital Stay (HOSPITAL_COMMUNITY): Payer: 59

## 2013-01-27 DIAGNOSIS — E876 Hypokalemia: Secondary | ICD-10-CM | POA: Diagnosis not present

## 2013-01-27 DIAGNOSIS — J9600 Acute respiratory failure, unspecified whether with hypoxia or hypercapnia: Secondary | ICD-10-CM

## 2013-01-27 LAB — POCT I-STAT 3, ART BLOOD GAS (G3+)
Acid-Base Excess: 9 mmol/L — ABNORMAL HIGH (ref 0.0–2.0)
Bicarbonate: 34.8 mEq/L — ABNORMAL HIGH (ref 20.0–24.0)
Patient temperature: 98.7
pCO2 arterial: 53.7 mmHg — ABNORMAL HIGH (ref 35.0–45.0)
pH, Arterial: 7.42 (ref 7.350–7.450)
pO2, Arterial: 202 mmHg — ABNORMAL HIGH (ref 80.0–100.0)

## 2013-01-27 LAB — BASIC METABOLIC PANEL
BUN: 11 mg/dL (ref 6–23)
CO2: 32 mEq/L (ref 19–32)
Calcium: 8.4 mg/dL (ref 8.4–10.5)
Chloride: 98 mEq/L (ref 96–112)
Creatinine, Ser: 0.73 mg/dL (ref 0.50–1.35)
GFR calc Af Amer: >90 mL/min (ref >90)
GFR calc Af Amer: >90 mL/min (ref >90)
GFR calc non Af Amer: >90 mL/min (ref >90)
Potassium: 2.5 mEq/L — CL (ref 3.5–5.1)
Potassium: 3.2 mEq/L — ABNORMAL LOW (ref 3.5–5.1)
Sodium: 142 mEq/L (ref 135–145)

## 2013-01-27 LAB — BODY FLUID CULTURE

## 2013-01-27 LAB — GLUCOSE, CAPILLARY
Glucose-Capillary: 220 mg/dL — ABNORMAL HIGH (ref 70–99)
Glucose-Capillary: 139 mg/dL — ABNORMAL HIGH (ref 70–99)

## 2013-01-27 LAB — CBC
Hemoglobin: 10.8 g/dL — ABNORMAL LOW (ref 13.0–17.0)
MCH: 26.9 pg (ref 26.0–34.0)
Platelets: 170 10*3/uL (ref 150–400)
RBC: 4.02 MIL/uL — ABNORMAL LOW (ref 4.22–5.81)
WBC: 13.5 10*3/uL — ABNORMAL HIGH (ref 4.0–10.5)

## 2013-01-27 LAB — VANCOMYCIN, TROUGH: Vancomycin Tr: 27.8 ug/mL (ref 10.0–20.0)

## 2013-01-27 LAB — TRIGLYCERIDES: Triglycerides: 130 mg/dL (ref <150)

## 2013-01-27 LAB — MAGNESIUM: Magnesium: 1.8 mg/dL (ref 1.5–2.5)

## 2013-01-27 MED ORDER — POTASSIUM CHLORIDE 10 MEQ/50ML IV SOLN
10.0000 meq | INTRAVENOUS | Status: AC
  Administered 2013-01-27 (×4): 10 meq via INTRAVENOUS
  Filled 2013-01-27 (×4): qty 50

## 2013-01-27 MED ORDER — VANCOMYCIN HCL 10 G IV SOLR
1250.0000 mg | Freq: Two times a day (BID) | INTRAVENOUS | Status: DC
  Administered 2013-01-27: 1250 mg via INTRAVENOUS
  Filled 2013-01-27 (×3): qty 1250

## 2013-01-27 MED ORDER — CHLORHEXIDINE GLUCONATE 0.12 % MT SOLN
15.0000 mL | Freq: Two times a day (BID) | OROMUCOSAL | Status: DC
  Administered 2013-01-27 – 2013-01-29 (×4): 15 mL via OROMUCOSAL
  Filled 2013-01-27 (×3): qty 15

## 2013-01-27 MED ORDER — BIOTENE DRY MOUTH MT LIQD
15.0000 mL | Freq: Two times a day (BID) | OROMUCOSAL | Status: DC
  Administered 2013-01-28 – 2013-01-29 (×3): 15 mL via OROMUCOSAL

## 2013-01-27 MED ORDER — FENTANYL CITRATE 0.05 MG/ML IJ SOLN
INTRAMUSCULAR | Status: AC
  Filled 2013-01-27: qty 2

## 2013-01-27 MED ORDER — VITAL AF 1.2 CAL PO LIQD: 1000.0000 mL | ORAL | Status: DC

## 2013-01-27 MED ORDER — METOPROLOL TARTRATE 1 MG/ML IV SOLN
INTRAVENOUS | Status: AC
  Filled 2013-01-27: qty 5

## 2013-01-27 MED ORDER — PROPOFOL 10 MG/ML IV EMUL
0.0000 ug/kg/min | INTRAVENOUS | Status: DC
  Administered 2013-01-27: 40 ug/kg/min via INTRAVENOUS
  Administered 2013-01-27: 45 ug/kg/min via INTRAVENOUS
  Administered 2013-01-27: 40 ug/kg/min via INTRAVENOUS
  Administered 2013-01-27: 5 ug/kg/min via INTRAVENOUS
  Administered 2013-01-27: 45 ug/kg/min via INTRAVENOUS
  Administered 2013-01-28: 25 ug/kg/min via INTRAVENOUS
  Administered 2013-01-28: 30 ug/kg/min via INTRAVENOUS
  Filled 2013-01-27 (×8): qty 100

## 2013-01-27 MED ORDER — MAGNESIUM OXIDE 400 (241.3 MG) MG PO TABS
800.0000 mg | ORAL_TABLET | Freq: Once | ORAL | Status: AC
  Administered 2013-01-27: 800 mg via ORAL
  Filled 2013-01-27: qty 2

## 2013-01-27 MED ORDER — VITAL HIGH PROTEIN PO LIQD
1000.0000 mL | ORAL | Status: DC
  Administered 2013-01-27: 1000 mL
  Administered 2013-01-28 (×2)
  Filled 2013-01-27 (×3): qty 1000

## 2013-01-27 MED ORDER — AMIODARONE HCL IN DEXTROSE 360-4.14 MG/200ML-% IV SOLN
INTRAVENOUS | Status: AC
  Filled 2013-01-27: qty 200

## 2013-01-27 MED ORDER — ETOMIDATE 2 MG/ML IV SOLN
30.0000 mg | Freq: Once | INTRAVENOUS | Status: AC
  Administered 2013-01-27: 30 mg via INTRAVENOUS
  Filled 2013-01-27: qty 15

## 2013-01-27 MED ORDER — POTASSIUM CHLORIDE 10 MEQ/50ML IV SOLN
INTRAVENOUS | Status: AC
  Filled 2013-01-27: qty 50

## 2013-01-27 MED ORDER — AMIODARONE HCL IN DEXTROSE 360-4.14 MG/200ML-% IV SOLN
30.0000 mg/h | INTRAVENOUS | Status: DC
  Administered 2013-01-27 – 2013-01-28 (×2): 30 mg/h via INTRAVENOUS
  Filled 2013-01-27 (×6): qty 200

## 2013-01-27 MED ORDER — FENTANYL CITRATE 0.05 MG/ML IJ SOLN: 100.0000 ug | INTRAMUSCULAR | Status: DC | PRN

## 2013-01-27 MED ORDER — MIDAZOLAM HCL 2 MG/2ML IJ SOLN
INTRAMUSCULAR | Status: AC
  Filled 2013-01-27: qty 4

## 2013-01-27 MED ORDER — MIDAZOLAM HCL 2 MG/2ML IJ SOLN
4.0000 mg | Freq: Once | INTRAMUSCULAR | Status: AC
  Administered 2013-01-27: 4 mg via INTRAVENOUS

## 2013-01-27 MED ORDER — ROCURONIUM BROMIDE 50 MG/5ML IV SOLN
100.0000 mg | Freq: Once | INTRAVENOUS | Status: AC
  Administered 2013-01-27: 100 mg via INTRAVENOUS
  Filled 2013-01-27: qty 10

## 2013-01-27 MED ORDER — POTASSIUM CHLORIDE 10 MEQ/50ML IV SOLN
10.0000 meq | INTRAVENOUS | Status: AC
  Administered 2013-01-27 (×6): 10 meq via INTRAVENOUS
  Filled 2013-01-27 (×2): qty 50

## 2013-01-27 MED ORDER — MORPHINE SULFATE 2 MG/ML IJ SOLN: 2.0000 mg | INTRAMUSCULAR | Status: DC | PRN

## 2013-01-27 MED ORDER — METOPROLOL TARTRATE 1 MG/ML IV SOLN
5.0000 mg | Freq: Once | INTRAVENOUS | Status: AC
  Administered 2013-01-27: 5 mg via INTRAVENOUS

## 2013-01-27 MED ORDER — PANTOPRAZOLE SODIUM 40 MG IV SOLR
40.0000 mg | INTRAVENOUS | Status: DC
  Administered 2013-01-27 – 2013-01-28 (×2): 40 mg via INTRAVENOUS
  Filled 2013-01-27 (×3): qty 40

## 2013-01-27 MED ORDER — POTASSIUM CHLORIDE IN NACL 40-0.9 MEQ/L-% IV SOLN
INTRAVENOUS | Status: DC
  Administered 2013-01-27 – 2013-01-28 (×2): via INTRAVENOUS
  Filled 2013-01-27 (×5): qty 1000

## 2013-01-27 MED ORDER — FUROSEMIDE 10 MG/ML IJ SOLN
40.0000 mg | Freq: Once | INTRAMUSCULAR | Status: AC
  Administered 2013-01-27: 40 mg via INTRAVENOUS
  Filled 2013-01-27: qty 4

## 2013-01-27 MED ORDER — FENTANYL CITRATE 0.05 MG/ML IJ SOLN
100.0000 ug | Freq: Once | INTRAMUSCULAR | Status: AC
  Administered 2013-01-27: 100 ug via INTRAVENOUS

## 2013-01-27 MED ORDER — PRO-STAT SUGAR FREE PO LIQD
60.0000 mL | Freq: Four times a day (QID) | ORAL | Status: DC
  Administered 2013-01-27 – 2013-01-28 (×4): 60 mL
  Filled 2013-01-27 (×7): qty 60

## 2013-01-27 MED ORDER — AMIODARONE LOAD VIA INFUSION
150.0000 mg | Freq: Once | INTRAVENOUS | Status: AC
  Administered 2013-01-27: 150 mg via INTRAVENOUS
  Filled 2013-01-27: qty 83.34

## 2013-01-27 MED ORDER — METOPROLOL TARTRATE 1 MG/ML IV SOLN: 2.5000 mg | INTRAVENOUS | Status: DC | PRN

## 2013-01-27 MED ORDER — FENTANYL CITRATE 0.05 MG/ML IJ SOLN
25.0000 ug | INTRAMUSCULAR | Status: DC | PRN
  Administered 2013-01-27: 100 ug via INTRAVENOUS
  Filled 2013-01-27: qty 2

## 2013-01-27 MED ORDER — FUROSEMIDE 10 MG/ML IJ SOLN
40.0000 mg | Freq: Four times a day (QID) | INTRAMUSCULAR | Status: AC
  Administered 2013-01-27 – 2013-01-28 (×3): 40 mg via INTRAVENOUS
  Filled 2013-01-27 (×3): qty 4

## 2013-01-27 MED ORDER — AMIODARONE HCL IN DEXTROSE 360-4.14 MG/200ML-% IV SOLN
60.0000 mg/h | INTRAVENOUS | Status: AC
  Administered 2013-01-27 (×2): 60 mg/h via INTRAVENOUS
  Filled 2013-01-27: qty 200

## 2013-01-27 NOTE — Progress Notes (Signed)
1030 Multiple pvc s noted on the monitor, notify MD on previous potassium level, requesting a recheck. Also brought to MD's attention of patient elevated B/P, tachypnea. . 1200 recheck potassium level 2.5, MD notified. Order for 4 runs of potassium.  0100 patient has increase labored breathing, notified MD of respiratory distress. Order to re intubate patient 0240 increase rhythm disturbance, frequent PVC's and runs of non-sustain v-tach. MD notified, Order for 5 mg of metoprolol, and subsequent order for Amiodarone.Marland Kitchen  0320 recheck potassium level, resulted at 3.3. MD order for more runs.  continue to monitor. Patient remain  Stable, will continue to monitor.

## 2013-01-27 NOTE — Procedures (Signed)
Intubation Procedure Note Jentzen Minasyan [id number] 10-05-1960  Procedure: Intubation Indications: Airway protection and maintenance  Procedure Details Consent: Unable to obtain consent because of emergent medical necessity. Time Out: Verified patient identification, verified procedure, site/side was marked, verified correct patient position, special equipment/implants available, medications/allergies/relevent history reviewed, required imaging and test results available.  Performed  Maximum sterile technique was used including cap, gloves, gown, hand hygiene and mask.  MAC and 4    Evaluation Hemodynamic Status: BP stable throughout; O2 sats: stable throughout Patient's Current Condition: stable Complications: No apparent complications Patient did tolerate procedure well. Chest X-ray ordered to verify placement.  CXR: pending.   ISMAIL, AHMAD 01/27/2013

## 2013-01-27 NOTE — Progress Notes (Signed)
ANTIBIOTIC CONSULT NOTE - FOLLOW UP  Pharmacy Consult:  Vancomycin / Zosyn Indication:  PNA / Empyema  No Known Allergies  Patient Measurements: Height: 6' (182.9 cm) Weight: 238 lb 8.6 oz (108.2 kg) IBW/kg (Calculated) : 77.6  Vital Signs: Temp: 98.9 F (37.2 C) (12/29 1216) Temp src: Oral (12/29 1700) BP: 109/64 mmHg (12/29 1700) Pulse Rate: 73 (12/29 1700) Intake/Output from previous day: 12/28 0701 - 12/29 0700 In: 1858.3 [P.O.:250; I.V.:158.3; IV Piggyback:1450] Out: 4650 [Urine:4650] Intake/Output from this shift: Total I/O In: 515.1 [I.V.:315.1; IV Piggyback:200] Out: -   Labs:  Recent Labs  01/26/13 0937 01/27/13 01/27/13 0400  WBC 11.7*  --  13.5*  HGB 10.3*  --  10.8*  PLT 171  --  170  CREATININE 0.72 0.73 0.71   Estimated Creatinine Clearance: 140.3 ml/min (by C-G formula based on Cr of 0.71).  Recent Labs  01/25/13 1457 01/27/13 1600  VANCOTROUGH 9.5* 27.8*     Microbiology: Recent Results (from the past 720 hour(s))  CULTURE, BLOOD (ROUTINE X 2)     Status: None   Collection Time    01/23/13  3:00 PM      Result Value Range Status   Specimen Description BLOOD LEFT HAND   Final   Special Requests NONE BOTTLES DRAWN AEROBIC ONLY 5CC ONLY   Final   Culture  Setup Time     Final   Value: 01/23/2013 22:10     Performed at [facility]   Culture     Final   Value:        BLOOD CULTURE RECEIVED NO GROWTH TO DATE CULTURE WILL BE HELD FOR 5 DAYS BEFORE ISSUING A FINAL NEGATIVE REPORT     Performed at [facility]   Report Status PENDING   Incomplete  CULTURE, BLOOD (ROUTINE X 2)     Status: None   Collection Time    01/23/13  3:00 PM      Result Value Range Status   Specimen Description BLOOD RIGHT HAND   Final   Special Requests NONE BOTTLES DRAWN AEROBIC AND ANAEROBIC 5CC EACH   Final   Culture  Setup Time     Final   Value: 01/23/2013 22:10     Performed at [facility]   Culture     Final   Value:        BLOOD  CULTURE RECEIVED NO GROWTH TO DATE CULTURE WILL BE HELD FOR 5 DAYS BEFORE ISSUING A FINAL NEGATIVE REPORT     Performed at [facility]   Report Status PENDING   Incomplete  URINE CULTURE     Status: None   Collection Time    01/23/13  9:54 PM      Result Value Range Status   Specimen Description URINE, CATHETERIZED   Final   Special Requests NONE   Final   Culture  Setup Time     Final   Value: 01/24/2013 08:48     Performed at [facility] Count     Final   Value: NO GROWTH     Performed at [facility]   Culture     Final   Value: NO GROWTH     Performed at [facility]   Report Status 01/25/2013 FINAL   Final  BODY FLUID CULTURE     Status: None   Collection Time    01/23/13  9:54 PM      Result Value  Range Status   Specimen Description PLEURAL FLUID   Final   Special Requests NONE   Final   Gram Stain     Final   Value: ABUNDANT WBC PRESENT,BOTH PMN AND MONONUCLEAR     MODERATE GRAM POSITIVE COCCI     IN PAIRS FEW GRAM NEGATIVE RODS     Gram Stain Report Called to,Read Back By and Verified With: Gram Stain Report Called to,Read Back By and Verified With: Linton Flemings RN 01/24/13 1040AM BY MILSH     Performed at [facility]   Culture     Final   Value: MODERATE MICROAEROPHILIC STREPTOCOCCI     Note: Standardized susceptibility testing for this organism is not available.     Performed at [facility]   Report Status 01/27/2013 FINAL   Final  CULTURE, RESPIRATORY (NON-EXPECTORATED)     Status: None   Collection Time    01/24/13 12:20 AM      Result Value Range Status   Specimen Description TRACHEAL ASPIRATE   Final   Special Requests NONE   Final   Gram Stain     Final   Value: FEW WBC PRESENT,BOTH PMN AND MONONUCLEAR     NO SQUAMOUS EPITHELIAL CELLS SEEN     NO ORGANISMS SEEN     Performed at [facility]   Culture     Final   Value: FEW YEAST CONSISTENT WITH CANDIDA SPECIES     Performed at  [facility]   Report Status 01/26/2013 FINAL   Final  MRSA PCR SCREENING     Status: None   Collection Time    01/24/13  7:14 AM      Result Value Range Status   MRSA by PCR NEGATIVE  NEGATIVE Final   Comment:            The GeneXpert MRSA Assay (FDA     approved for NASAL specimens     only), is one component of a     comprehensive MRSA colonization     surveillance program. It is not     intended to diagnose MRSA     infection nor to guide or     monitor treatment for     MRSA infections.      Assessment: 52 YOM on vancomycin and Zosyn for PNA and empyema.  Patient's renal function remains stable and his vancomycin trough is supra-therapeutic, likely due to accumulation.  Tonight's dose was charted as given but only a small amount of the drug was infused.  12/25 Vanc >> 12/25 Zosyn >>  12/27 VT 9.5 mcg/mL (1gm q8h) 12/29 VT 27.8 mcg/mL (1500mg  q8h)  12/25 pleural fluid >> microareophilic strep 12/25 Bld x2 >> ngtd 12/25 Urine >> neg 12/26 Trach asp >> few yeast   Goal of Therapy:  Vancomycin trough level 15-20 mcg/ml   Plan:  - Change vanc to 1250mg  IV Q12H - Continue Zosyn 3.375gm IV Q8H, 4 hr infusion - Monitor renal fxn, clinical course, vanc trough at new Css    Marijo Quizon D. Laney Potash, PharmD, BCPS Pager:  [phone] 01/27/2013, 6:27 PM

## 2013-01-27 NOTE — Progress Notes (Signed)
NUTRITION FOLLOW UP  Intervention:    Restart TF via OGT with Vital HP at 20 ml/h, goal rate, with Prostat 60 ml QID to provide 1280 kcals, 162 gm protein, 401 ml free water daily.  Above TF regimen plus Propofol at current rate will provide a total of 2083 kcals (25 kcals/kg ideal weight) and 162 gm protein (100% of estimated needs).  Nutrition Dx:   Inadequate oral intake related to inability to eat as evidenced by NPO status. Ongoing.  Goal:   Enteral nutrition to provide 60-70% of estimated calorie needs (22-25 kcals/kg ideal body weight) and 100% of estimated protein needs, based on ASPEN guidelines for permissive underfeeding in critically ill obese individuals. Unmet.  Monitor:   TF tolerance/adequacy, weight trend, labs, vent status.  Assessment:   PMHx significant for new onset DM. Admitted with dyspnea and fever. Work-up reveals bilateral PNA and R pleural effusion with R chest tube.  Patient currently intubated on ventilator support. Patient was extubated on 12/27, but required re-intubation last night. Now requiring Propofol. TF off since extubation on 12/27. Per discussion in ICU rounds today, plans to resume TF now that patient has been re-intubated. MV: 10.4 L/min Temp (24hrs), Avg:98.9 F (37.2 C), Min:98.7 F (37.1 C), Max:99.4 F (37.4 C)  Propofol: 30.4 ml/hr providing 803 kcal per day.  Height: Ht Readings from Last 1 Encounters:  01/23/13 6' (1.829 m)    Weight Status:   Wt Readings from Last 1 Encounters:  01/27/13 238 lb 8.6 oz (108.2 kg)  01/23/13  245 lb (111.131 kg)  Body mass index is 32.34 kg/(m^2).  Re-estimated needs:  Kcal: 2260 Protein: 162 gm Fluid: 2.2-2.4 L  Skin: chest incision  Diet Order: NPO   Intake/Output Summary (Last 24 hours) at 01/27/13 1256 Last data filed at 01/27/13 1100  Gross per 24 hour  Intake 1522.02 ml  Output   4300 ml  Net -2777.98 ml    Last BM: none documented   Labs:   Recent Labs Lab  01/23/13 1830 01/24/13 0445  01/26/13 0937 01/27/13 01/27/13 0400  NA 130* 135  < > 134* 140 142  K 3.9 3.3*  < > 2.5* 2.5* 3.2*  CL 88* 94*  < > 94* 99 98  CO2 28 31  < > 32 33* 32  BUN 11 11  < > 13 11 12   CREATININE 0.65 0.62  < > 0.72 0.73 0.71  CALCIUM 8.3* 8.3*  < > 8.4 8.4 8.5  MG  --  1.7  --   --   --  1.8  PHOS  --  3.7  --   --   --   --   GLUCOSE 393* 179*  < > 162* 118* 219*  < > = values in this interval not displayed.  CBG (last 3)   Recent Labs  01/26/13 2145 01/27/13 0808 01/27/13 1150  GLUCAP 108* 220* 139*    Scheduled Meds: . heparin subcutaneous  5,000 Units Subcutaneous Q8H  . insulin aspart  0-15 Units Subcutaneous TID WC  . insulin aspart  0-5 Units Subcutaneous QHS  . insulin glargine  20 Units Subcutaneous Daily  . pantoprazole (PROTONIX) IV  40 mg Intravenous Q24H  . piperacillin-tazobactam (ZOSYN)  IV  3.375 g Intravenous Q8H  . vancomycin  1,500 mg Intravenous Q8H    Continuous Infusions: . 0.9 % NaCl with KCl 40 mEq / L 50 mL/hr at 01/27/13 0750  . amiodarone (NEXTERONE PREMIX) 360 mg/200 mL dextrose 30  mg/hr (01/27/13 0900)  . propofol 45 mcg/kg/min (01/27/13 1610)    Joaquin Courts, RD, LDN, CNSC Pager [phone] After Hours Pager [phone]

## 2013-01-27 NOTE — Progress Notes (Signed)
Inpatient Diabetes Program Recommendations  AACE/ADA: New Consensus Statement on Inpatient Glycemic Control (2013)  Target Ranges:  Prepandial:   less than 140 mg/dL      Peak postprandial:   less than 180 mg/dL (1-2 hours)      Critically ill patients:  140 - 180 mg/dL   Reason for Assessment: Elevated glucose  Note: Currently patient has order for CBG's ac & HS with moderate correction Novolog ordered tid with meals and HS scale at HS.  Patient now NPO and restarting tube feeding.  Request MD consider use of Adult Hyperglycemia Protocol Order Set Phase 1.  If this order set not used, CBG's and correction need to be every 4 hours.  Thank you.  Stephen Stanton S. Elsie Lincoln, RN, CNS, CDE Inpatient Diabetes Program, team pager [phone]

## 2013-01-27 NOTE — Progress Notes (Signed)
  Amiodarone Drug - Drug Interaction Consult Note  Recommendations: Monitor vitals closely Amiodarone is metabolized by the cytochrome P450 system and therefore has the potential to cause many drug interactions. Amiodarone has an average plasma half-life of 50 days (range 20 to 100 days).   There is potential for drug interactions to occur several weeks or months after stopping treatment and the onset of drug interactions may be slow after initiating amiodarone.   []  Statins: Increased risk of myopathy. Simvastatin- restrict dose to 20mg  daily. Other statins: counsel patients to report any muscle pain or weakness immediately.  []  Anticoagulants: Amiodarone can increase anticoagulant effect. Consider warfarin dose reduction. Patients should be monitored closely and the dose of anticoagulant altered accordingly, remembering that amiodarone levels take several weeks to stabilize.  []  Antiepileptics: Amiodarone can increase plasma concentration of phenytoin, the dose should be reduced. Note that small changes in phenytoin dose can result in large changes in levels. Monitor patient and counsel on signs of toxicity.  [x]  Beta blockers: increased risk of bradycardia, AV block and myocardial depression. Sotalol - avoid concomitant use.  []   Calcium channel blockers (diltiazem and verapamil): increased risk of bradycardia, AV block and myocardial depression.  []   Cyclosporine: Amiodarone increases levels of cyclosporine. Reduced dose of cyclosporine is recommended.  []  Digoxin dose should be halved when amiodarone is started.  []  Diuretics: increased risk of cardiotoxicity if hypokalemia occurs.  []  Oral hypoglycemic agents (glyburide, glipizide, glimepiride): increased risk of hypoglycemia. Patient's glucose levels should be monitored closely when initiating amiodarone therapy.   []  Drugs that prolong the QT interval:  Torsades de pointes risk may be increased with concurrent use - avoid if  possible.  Monitor QTc, also keep magnesium/potassium WNL if concurrent therapy can't be avoided. Marland Kitchen Antibiotics: e.g. fluoroquinolones, erythromycin. . Antiarrhythmics: e.g. quinidine, procainamide, disopyramide, sotalol. . Antipsychotics: e.g. phenothiazines, haloperidol.  . Lithium, tricyclic antidepressants, and methadone. Thank You,  Abran Duke  01/27/2013 2:38 AM

## 2013-01-27 NOTE — Progress Notes (Signed)
eLink Physician-Brief Progress Note Patient Name: Stephen Stanton DOB: Jan 14, 1961 MRN: [id number]  Date of Service  01/27/2013   HPI/Events of Note  Not on SUP  eICU Interventions  PPI ordered    Intervention Category Intermediate Interventions: Best-practice therapies (e.g. DVT, beta blocker, etc.)  Shan Levans 01/27/2013, 3:33 AM

## 2013-01-27 NOTE — Progress Notes (Signed)
PULMONARY / CRITICAL CARE MEDICINE  Name: Stephen Stanton MRN: [id number] DOB: October 05, 1960    ADMISSION DATE:  01/23/2013 CONSULTATION DATE:  01/23/2013  REFERRING MD :  HP EDP PRIMARY SERVICE:  PCCM  CHIEF COMPLAINT:  Shortness of breath  BRIEF PATIENT DESCRIPTION: 52 yo with past medical history of new onset diabetes ( brief admission one week ago) brought to [facility] ED with with dyspnea and fever.  In ED required BiPAP. Chest imaging demonstrated bilateral pneumonia and likely empyema on the R.  Transferred [facility] for further management.  SIGNIFICANT EVENTS / STUDIES:  12/25  Presented to [facility] ED with respiratory distress and fever 12/25  CTA chest: No central PE, extensive bilateral pneumonia R>L, large empyema with air fluid level on the right 12/25  Case discussed with TCTS >>> Chest tube by PCCM, TCTS will evaluate in am 12/25  Transferred to [facility] 12/25  Intubated, CVL placed, R tube thoracotomy  12/26  TTE >>> EF 50-55, no RWMA 12/26  Venous Doppler >>> no DVT 12/28  Chest CT >>> bilateral airspace disease, near complete resolution of bilateral pneumonia  LINES / TUBES: OETT 12/25 >>> 12/27 OGT 12/25 >>> 12/27 R IJ TLC 12/25 >>> Foley 12/25 >>> 12/27 R chest tube 12/25 >>>  CULTURES: 12/25 MRSA >>> neg 12/25 Blood >>> 12/25 Urine >>> neg 12/25 Respiratory >>>yeast 12/25 Pleural fluid >>> GPC in pairs / GNB >>> microaerophilic strep  ANTIBIOTICS: Cefepime 12/25 x 1 Zosyn 12/25 >>> Vancomycin 12/25 >>>  INTERVAL HISTORY:  Intubated overnight  VITAL SIGNS: Temp:  [98.7 F (37.1 C)-99.4 F (37.4 C)] 98.9 F (37.2 C) (12/29 1216) Pulse Rate:  [61-134] 61 (12/29 1241) Resp:  [18-51] 22 (12/29 1241) BP: (77-183)/(51-124) 83/54 mmHg (12/29 1241) SpO2:  [92 %-100 %] 100 % (12/29 1241) FiO2 (%):  [70 %-100 %] 70 % (12/29 1252) Weight:  [108.2 kg (238 lb 8.6 oz)] 108.2 kg (238 lb 8.6 oz) (12/29 0300)  HEMODYNAMICS:    VENTILATOR SETTINGS: Vent Mode:  [-] PRVC FiO2 (%):   [70 %-100 %] 70 % Set Rate:  [22 bmp] 22 bmp Vt Set:  [470 mL] 470 mL PEEP:  [8 cmH20] 8 cmH20 Plateau Pressure:  [24 cmH20-25 cmH20] 24 cmH20 INTAKE / OUTPUT: Intake/Output     12/28 0701 - 12/29 0700 12/29 0701 - 12/30 0700   P.O. 250    I.V. (mL/kg) 158.3 (1.5) 163.7 (1.5)   NG/GT     IV Piggyback 1450 200   Total Intake(mL/kg) 1858.3 (17.2) 363.7 (3.4)   Urine (mL/kg/hr) 4650 (1.8)    Chest Tube     Total Output 4650     Net -2791.7 +363.7        Urine Occurrence 3 x    Stool Occurrence 1 x      PHYSICAL EXAMINATION: General:  Sedated, comfortable on vent HEENT: NCAT, PERRL, ETT in place PULM: few crackles bilaterally CV: RRR, no mgr AB: BS+, soft, nontender Ext: warm, ankle edema noted Neuro: heavily sedated  LABS: CBC  Recent Labs Lab 01/24/13 0445 01/26/13 0937 01/27/13 0400  WBC 8.7 11.7* 13.5*  HGB 10.9* 10.3* 10.8*  HCT 34.5* 32.2* 33.8*  PLT 201 171 170   Coag's  Recent Labs Lab 01/23/13 1830  APTT 21*  INR 1.23   BMET  Recent Labs Lab 01/26/13 0937 01/27/13 01/27/13 0400  NA 134* 140 142  K 2.5* 2.5* 3.2*  CL 94* 99 98  CO2 32 33* 32  BUN 13 11 12  CREATININE 0.72 0.73 0.71  GLUCOSE 162* 118* 219*   Electrolytes  Recent Labs Lab 01/24/13 0445  01/26/13 0937 01/27/13 01/27/13 0400  CALCIUM 8.3*  < > 8.4 8.4 8.5  MG 1.7  --   --   --  1.8  PHOS 3.7  --   --   --   --   < > = values in this interval not displayed. Sepsis Markers  Recent Labs Lab 01/23/13 2152 01/24/13 0445 01/24/13 1153 01/24/13 1745 01/25/13 0150 01/25/13 0405  LATICACIDVEN  --   --  1.5 1.4 1.3  --   PROCALCITON 18.36 21.56  --   --   --  14.78   ABG  Recent Labs Lab 01/23/13 2256 01/24/13 0347 01/27/13 0233  PHART 7.471* 7.347* 7.420  PCO2ART 38.8 57.0* 53.7*  PO2ART 59.0* 75.0* 202.0*   Liver Enzymes  Recent Labs Lab 01/23/13 1410 01/23/13 1830  AST 52* 55*  ALT 52 52  ALKPHOS 97 88  BILITOT 0.4 0.6  ALBUMIN 2.1* 1.7*    Cardiac Enzymes  Recent Labs Lab 01/23/13 1410 01/23/13 1749 01/23/13 2220 01/24/13 0445  TROPONINI 0.44* 0.50* 0.42* 0.41*  PROBNP 488.1* 1140.0*  --   --    Glucose  Recent Labs Lab 01/26/13 0720 01/26/13 1112 01/26/13 1645 01/26/13 2145 01/27/13 0808 01/27/13 1150  GLUCAP 142* 151* 166* 108* 220* 139*   CXR:  None today  ASSESSMENT / PLAN:  PULMONARY A:   Bilateral pneumonia (R>L) / HCAP (recent hospitalization).  Empyema > s/p chest tube Acute hypoxemic respiratory failure> 12/28 due to worsening pulm edema? P: Goal SpO2>92 Full vent support Check proBNP Leave chest tube in place  CARDIOVASCULAR A:  Sepsis - resolved.  Lactate reassuring.  Normal LVF.  DVT ruled out. P:  Diurese 12/29 Minimize propofol given mild hypotension  RENAL A:   Hypokalemia. P:   Trend BMP Replete K  GASTROINTESTINAL A:  No active issues. P:   Diet D/c Protonix for GI Px  HEMATOLOGIC A:  Mild anemia. P:  Trend CBC Heparin for DVT Px  INFECTIOUS A:  HCAP. Empyema. P:   Cultures and antibiotics as above Consider d/c vanc 12/30  ENDOCRINE  A:  DM. Hyperglycemia. P:   SSI change hc&hs Lantus 20  NEUROLOGIC A:  Sedation needs for vent synchrony P:   Propofol gtt Add fentanyl to try to minimize propofol dose  Family: Wife updated at bedside by me 12/29 Code: Full   I have personally obtained history, examined patient, evaluated and interpreted laboratory and imaging results, reviewed medical records, formulated assessment / plan and placed orders.  CRITICAL CARE:  The patient is critically ill with multiple organ systems failure and requires high complexity decision making for assessment and support, frequent evaluation and titration of therapies, application of advanced monitoring technologies and extensive interpretation of multiple databases. Critical Care Time devoted to patient care services described in this note is 35 minutes.   Yolonda Kida PCCM Pager: [phone] Cell: [phone] If no response, call [phone]   01/27/2013, 1:24 PM

## 2013-01-27 NOTE — Progress Notes (Signed)
eLink Physician-Brief Progress Note Patient Name: Stephen Stanton DOB: May 04, 1960 MRN: [id number]  Date of Service  01/27/2013   HPI/Events of Note   Pt with ongoing HTN, Vtach, sinus tachy  eICU Interventions  ABGs acceptable. Pt reintubated . Started amiodarone, gave lopressor,  Await Kcl infusion then recheck K and Mag   Intervention Category Major Interventions: Respiratory failure - evaluation and management;Hypertension - evaluation and management;Arrhythmia - evaluation and management  Shan Levans 01/27/2013, 2:42 AM

## 2013-01-27 NOTE — Progress Notes (Signed)
eLink Physician-Brief Progress Note Patient Name: Golden Gilreath DOB: 30-Jul-1960 MRN: [id number]  Date of Service  01/27/2013   HPI/Events of Note   Pt in acute resp distress, CT chest showed bilat AS disease  eICU Interventions  chk cxr, abg Give more lasix prob needs intubation, fellow to see    Intervention Category Intermediate Interventions: Respiratory distress - evaluation and management  Shan Levans 01/27/2013, 1:49 AM

## 2013-01-28 ENCOUNTER — Inpatient Hospital Stay (HOSPITAL_COMMUNITY): Payer: 59

## 2013-01-28 DIAGNOSIS — N179 Acute kidney failure, unspecified: Secondary | ICD-10-CM

## 2013-01-28 LAB — GLUCOSE, CAPILLARY
Glucose-Capillary: 140 mg/dL — ABNORMAL HIGH (ref 70–99)
Glucose-Capillary: 136 mg/dL — ABNORMAL HIGH (ref 70–99)
Glucose-Capillary: 209 mg/dL — ABNORMAL HIGH (ref 70–99)
Glucose-Capillary: 107 mg/dL — ABNORMAL HIGH (ref 70–99)

## 2013-01-28 LAB — CBC
HCT: 29.9 % — ABNORMAL LOW (ref 39.0–52.0)
MCHC: 31.4 g/dL (ref 30.0–36.0)
MCV: 86.2 fL (ref 78.0–100.0)
Platelets: 209 10*3/uL (ref 150–400)
RDW: 14.9 % (ref 11.5–15.5)
WBC: 10.4 10*3/uL (ref 4.0–10.5)

## 2013-01-28 LAB — BLOOD GAS, ARTERIAL
Acid-Base Excess: 6.4 mmol/L — ABNORMAL HIGH (ref 0.0–2.0)
Bicarbonate: 30.1 mEq/L — ABNORMAL HIGH (ref 20.0–24.0)
Drawn by: 10006
MECHVT: 470 mL
PEEP: 5 cmH2O
Patient temperature: 99
TCO2: 31.4 mmol/L (ref 0–100)
pH, Arterial: 7.474 — ABNORMAL HIGH (ref 7.350–7.450)
pO2, Arterial: 81.5 mmHg (ref 80.0–100.0)

## 2013-01-28 LAB — BASIC METABOLIC PANEL
BUN: 34 mg/dL — ABNORMAL HIGH (ref 6–23)
BUN: 40 mg/dL — ABNORMAL HIGH (ref 6–23)
Calcium: 8.2 mg/dL — ABNORMAL LOW (ref 8.4–10.5)
Chloride: 101 mEq/L (ref 96–112)
Creatinine, Ser: 2.19 mg/dL — ABNORMAL HIGH (ref 0.50–1.35)
GFR calc Af Amer: 39 mL/min — ABNORMAL LOW (ref >90)
GFR calc non Af Amer: 25 mL/min — ABNORMAL LOW (ref >90)
Glucose, Bld: 125 mg/dL — ABNORMAL HIGH (ref 70–99)
Potassium: 3 mEq/L — ABNORMAL LOW (ref 3.7–5.3)
Potassium: 3.3 mEq/L — ABNORMAL LOW (ref 3.7–5.3)

## 2013-01-28 MED ORDER — POTASSIUM CHLORIDE 10 MEQ/100ML IV SOLN: 10.0000 meq | INTRAVENOUS | Status: DC

## 2013-01-28 MED ORDER — FENTANYL CITRATE 0.05 MG/ML IJ SOLN: 12.5000 ug | INTRAMUSCULAR | Status: DC | PRN

## 2013-01-28 MED ORDER — INSULIN GLARGINE 100 UNIT/ML ~~LOC~~ SOLN
30.0000 [IU] | Freq: Every day | SUBCUTANEOUS | Status: DC
  Administered 2013-01-28 – 2013-02-06 (×10): 30 [IU] via SUBCUTANEOUS
  Filled 2013-01-28 (×11): qty 0.3

## 2013-01-28 MED ORDER — WHITE PETROLATUM GEL
Status: AC
  Administered 2013-01-28: 0.2
  Filled 2013-01-28: qty 5

## 2013-01-28 MED ORDER — POTASSIUM CHLORIDE 10 MEQ/50ML IV SOLN
10.0000 meq | INTRAVENOUS | Status: AC
  Administered 2013-01-28 (×3): 10 meq via INTRAVENOUS
  Filled 2013-01-28 (×2): qty 50

## 2013-01-28 MED ORDER — INSULIN ASPART 100 UNIT/ML ~~LOC~~ SOLN
2.0000 [IU] | SUBCUTANEOUS | Status: DC
  Administered 2013-01-28: 6 [IU] via SUBCUTANEOUS
  Administered 2013-01-28: 2 [IU] via SUBCUTANEOUS
  Administered 2013-01-28: 4 [IU] via SUBCUTANEOUS
  Administered 2013-01-28 – 2013-01-29 (×2): 2 [IU] via SUBCUTANEOUS

## 2013-01-28 NOTE — Progress Notes (Addendum)
Remaining 40 cc of Propofol in bottle wasted with R. Fountain, RN.  10:07- Tube feeding stopped with extubation, removal of OGT.  11:53- Bedside swallow evaluation completed.  Patient successfully swallowed ice chips, 90 mls of water and two tablespoons of applesauce with no difficulty or coughing.  MD notified.

## 2013-01-28 NOTE — Progress Notes (Signed)
PULMONARY / CRITICAL CARE MEDICINE  Name: Stephen Stanton MRN: [id number] DOB: 18-Mar-1960    ADMISSION DATE:  01/23/2013 CONSULTATION DATE:  01/23/2013  REFERRING MD :  HP EDP PRIMARY SERVICE:  PCCM  CHIEF COMPLAINT:  Shortness of breath  BRIEF PATIENT DESCRIPTION: 52 yo with past medical history of new onset diabetes ( brief admission one week ago) brought to [facility] ED with with dyspnea and fever.  In ED required BiPAP. Chest imaging demonstrated bilateral pneumonia and likely empyema on the R.  Transferred [facility] for further management.  SIGNIFICANT EVENTS / STUDIES:  12/25  Presented to [facility] ED with respiratory distress and fever 12/25  CTA chest: No central PE, extensive bilateral pneumonia R>L, large empyema with air fluid level on the right 12/25  Case discussed with TCTS >>> Chest tube by PCCM, TCTS will evaluate in am 12/25  Transferred to [facility] 12/25  Intubated, CVL placed, R tube thoracotomy  12/26  TTE >>> EF 50-55, no RWMA 12/26  Venous Doppler >>> no DVT 12/28  Chest CT >>> bilateral airspace disease, near complete resolution of bilateral pneumonia  LINES / TUBES: OETT 12/25 >>> 12/27 OGT 12/25 >>> 12/27 R IJ TLC 12/25 >>> Foley 12/25 >>> 12/27 R chest tube 12/25 >>> OETT 12/28>> 12/30  CULTURES: 12/25 MRSA >>> neg 12/25 Blood >>> 12/25 Urine >>> neg 12/25 Respiratory >>>yeast 12/25 Pleural fluid >>> GPC in pairs / GNB >>> microaerophilic strep  ANTIBIOTICS: Cefepime 12/25 x 1 Zosyn 12/25 >>> Vancomycin 12/25 >>> 12/30  INTERVAL HISTORY:  Doing great on SBT  VITAL SIGNS: Temp:  [97.8 F (36.6 C)-99.2 F (37.3 C)] 97.8 F (36.6 C) (12/30 0848) Pulse Rate:  [60-78] 68 (12/30 0816) Resp:  [0-24] 22 (12/30 0816) BP: (77-117)/(49-72) 117/72 mmHg (12/30 0816) SpO2:  [97 %-100 %] 100 % (12/30 0816) FiO2 (%):  [40 %-70 %] 40 % (12/30 0817) Weight:  [111.1 kg (244 lb 14.9 oz)] 111.1 kg (244 lb 14.9 oz) (12/30 0200)  HEMODYNAMICS:    VENTILATOR SETTINGS: Vent Mode:   [-] PSV FiO2 (%):  [40 %-70 %] 40 % Set Rate:  [22 bmp] 22 bmp Vt Set:  [470 mL] 470 mL PEEP:  [5 cmH20-8 cmH20] 5 cmH20 Pressure Support:  [5 cmH20] 5 cmH20 Plateau Pressure:  [20 cmH20-24 cmH20] 22 cmH20 INTAKE / OUTPUT: Intake/Output     12/29 0701 - 12/30 0700 12/30 0701 - 12/31 0700   P.O.     I.V. (mL/kg) 1541 (13.9) 30.2 (0.3)   NG/GT 253.3 20   IV Piggyback 550    Total Intake(mL/kg) 2344.4 (21.1) 50.2 (0.5)   Urine (mL/kg/hr) 840 (0.3) 235 (0.9)   Total Output 840 235   Net +1504.4 -184.8          PHYSICAL EXAMINATION: General:  Awake, comfortable on vent HEENT: NCAT, PERRL, ETT in place PULM: rare crackles today, R chest tube CV: RRR, no mgr AB: BS+, soft, nontender Ext: warm, ankle edema noted Neuro: Awake, alert on vent  LABS: CBC  Recent Labs Lab 01/26/13 0937 01/27/13 0400 01/28/13 0434  WBC 11.7* 13.5* 10.4  HGB 10.3* 10.8* 9.4*  HCT 32.2* 33.8* 29.9*  PLT 171 170 209   Coag's  Recent Labs Lab 01/23/13 1830  APTT 21*  INR 1.23   BMET  Recent Labs Lab 01/27/13 01/27/13 0400 01/28/13 0434  NA 140 142 146  K 2.5* 3.2* 3.0*  CL 99 98 101  CO2 33* 32 31  BUN 11 12 34*  CREATININE  0.73 0.71 2.19*  GLUCOSE 118* 219* 150*   Electrolytes  Recent Labs Lab 01/24/13 0445  01/27/13 01/27/13 0400 01/28/13 0434  CALCIUM 8.3*  < > 8.4 8.5 8.2*  MG 1.7  --   --  1.8  --   PHOS 3.7  --   --   --   --   < > = values in this interval not displayed. Sepsis Markers  Recent Labs Lab 01/23/13 2152 01/24/13 0445 01/24/13 1153 01/24/13 1745 01/25/13 0150 01/25/13 0405  LATICACIDVEN  --   --  1.5 1.4 1.3  --   PROCALCITON 18.36 21.56  --   --   --  14.78   ABG  Recent Labs Lab 01/24/13 0347 01/27/13 0233 01/28/13 0522  PHART 7.347* 7.420 7.474*  PCO2ART 57.0* 53.7* 41.6  PO2ART 75.0* 202.0* 81.5   Liver Enzymes  Recent Labs Lab 01/23/13 1410 01/23/13 1830  AST 52* 55*  ALT 52 52  ALKPHOS 97 88  BILITOT 0.4 0.6  ALBUMIN  2.1* 1.7*   Cardiac Enzymes  Recent Labs Lab 01/23/13 1410 01/23/13 1749 01/23/13 2220 01/24/13 0445  TROPONINI 0.44* 0.50* 0.42* 0.41*  PROBNP 488.1* 1140.0*  --   --    Glucose  Recent Labs Lab 01/27/13 1150 01/27/13 1552 01/27/13 2019 01/28/13 0010 01/28/13 0405 01/28/13 0800  GLUCAP 139* 121* 208* 242* 140* 136*   CXR:  None today  ASSESSMENT / PLAN:  PULMONARY A:   Bilateral pneumonia (R>L) / HCAP (recent hospitalization).  Empyema > s/p chest tube and doing well Acute hypoxemic respiratory failure 12/28 presumably related to pulm edema> improving P: Extubate Incentive spiro Speech eval post extubation Leave chest tube in place  CARDIOVASCULAR A:  Sepsis - resolved.  Diastolic dysfunction? >Normal LVF, but improved oxygenation with diuresis DVT ruled out. Afib with RVR > resolved P:  Goal net even fluid balance Hold lasix 12/30 with AKI D/C amiodarone  RENAL A:  AKI > due to over diuresis? Hypokalemia. P:   Hold lasix Replete K Repeat BMET  GASTROINTESTINAL A:  No active issues. P:   Advance Diet post extubation D/c PPI  HEMATOLOGIC A:  Mild anemia. P:  Trend CBC Heparin for DVT Px  INFECTIOUS A:  HCAP. Empyema. P:   Cultures and antibiotics as above D/c vanc  ENDOCRINE  A:  DM. Hyperglycemia. P:   SSI change hc&hs Lantus 20 Watch glucose carefully after stopping tube feedings  NEUROLOGIC A:  Sedation needs for vent synchrony >  P:   Minimize sedation post extubation  Family: Wife updated at bedside by me 12/29 Code: Full   I have personally obtained history, examined patient, evaluated and interpreted laboratory and imaging results, reviewed medical records, formulated assessment / plan and placed orders.  CRITICAL CARE:  The patient is critically ill with multiple organ systems failure and requires high complexity decision making for assessment and support, frequent evaluation and titration of therapies, application  of advanced monitoring technologies and extensive interpretation of multiple databases. Critical Care Time devoted to patient care services described in this note is 35 minutes.   Yolonda Kida PCCM Pager: [phone] Cell: [phone] If no response, call [phone]   01/28/2013, 9:14 AM

## 2013-01-28 NOTE — Procedures (Addendum)
Extubation Procedure Note  Patient Details:   Name: Coree Brame DOB: [birth year] MRN: [id number]   Airway Documentation:   Patient weaned successfully from vent support. Cuff leak was present. Patient was extubated and is now wearing a nasal cannula with O2 running at 4lpm.  Evaluation  O2 sats: stable throughout Complications: No apparent complications Patient did tolerate procedure well. Bilateral Breath Sounds: Clear;Diminished;Rhonchi Suctioning: Airway Yes  Clearance Coots 01/28/2013, 9:54 AM

## 2013-01-28 NOTE — Progress Notes (Signed)
eLink Physician-Brief Progress Note Patient Name: Nehal Shives DOB: 11-Jul-1960 MRN: [id number]  Date of Service  01/28/2013   HPI/Events of Note     eICU Interventions  Changed diet to carb modified   Intervention Category Minor Interventions: Routine modifications to care plan (e.g. PRN medications for pain, fever)  Jamont Mellin S. 01/28/2013, 3:27 PM

## 2013-01-29 ENCOUNTER — Inpatient Hospital Stay (HOSPITAL_COMMUNITY): Payer: 59

## 2013-01-29 LAB — BASIC METABOLIC PANEL
BUN: 46 mg/dL — ABNORMAL HIGH (ref 6–23)
CO2: 31 mEq/L (ref 19–32)
Calcium: 8.2 mg/dL — ABNORMAL LOW (ref 8.4–10.5)
Chloride: 104 mEq/L (ref 96–112)
Chloride: 102 mEq/L (ref 96–112)
Creatinine, Ser: 3.46 mg/dL — ABNORMAL HIGH (ref 0.50–1.35)
Creatinine, Ser: 3.83 mg/dL — ABNORMAL HIGH (ref 0.50–1.35)
GFR calc Af Amer: 22 mL/min — ABNORMAL LOW (ref >90)
GFR calc Af Amer: 20 mL/min — ABNORMAL LOW (ref >90)
GFR calc non Af Amer: 17 mL/min — ABNORMAL LOW (ref >90)
Glucose, Bld: 90 mg/dL (ref 70–99)
Potassium: 3.1 mEq/L — ABNORMAL LOW (ref 3.7–5.3)
Sodium: 148 mEq/L — ABNORMAL HIGH (ref 137–147)

## 2013-01-29 LAB — GLUCOSE, CAPILLARY
Glucose-Capillary: 80 mg/dL (ref 70–99)
Glucose-Capillary: 89 mg/dL (ref 70–99)
Glucose-Capillary: 92 mg/dL (ref 70–99)

## 2013-01-29 LAB — URINALYSIS, ROUTINE W REFLEX MICROSCOPIC
Bilirubin Urine: NEGATIVE
Hgb urine dipstick: NEGATIVE
Ketones, ur: NEGATIVE mg/dL
Leukocytes, UA: NEGATIVE
Protein, ur: NEGATIVE mg/dL
Specific Gravity, Urine: 1.013 (ref 1.005–1.030)
Urobilinogen, UA: 0.2 mg/dL (ref 0.0–1.0)

## 2013-01-29 LAB — CULTURE, BLOOD (ROUTINE X 2)
Culture: NO GROWTH
Culture: NO GROWTH

## 2013-01-29 LAB — CBC WITH DIFFERENTIAL/PLATELET
Basophils Absolute: 0 10*3/uL (ref 0.0–0.1)
Eosinophils Absolute: 0 10*3/uL (ref 0.0–0.7)
Eosinophils Relative: 0 % (ref 0–5)
HCT: 30.4 % — ABNORMAL LOW (ref 39.0–52.0)
Hemoglobin: 9.5 g/dL — ABNORMAL LOW (ref 13.0–17.0)
Lymphocytes Relative: 13 % (ref 12–46)
Lymphs Abs: 1.6 10*3/uL (ref 0.7–4.0)
MCV: 84 fL (ref 78.0–100.0)
Monocytes Relative: 6 % (ref 3–12)
Neutro Abs: 9.9 10*3/uL — ABNORMAL HIGH (ref 1.7–7.7)
RBC: 3.62 MIL/uL — ABNORMAL LOW (ref 4.22–5.81)
RDW: 13.9 % (ref 11.5–15.5)
WBC: 12.2 10*3/uL — ABNORMAL HIGH (ref 4.0–10.5)

## 2013-01-29 MED ORDER — AMOXICILLIN-POT CLAVULANATE 250-125 MG PO TABS
1.0000 | ORAL_TABLET | Freq: Two times a day (BID) | ORAL | Status: DC
  Filled 2013-01-29: qty 1

## 2013-01-29 MED ORDER — POTASSIUM CHLORIDE 20 MEQ/15ML (10%) PO LIQD
20.0000 meq | Freq: Once | ORAL | Status: AC
  Administered 2013-01-29: 20 meq
  Filled 2013-01-29: qty 15

## 2013-01-29 MED ORDER — AMOXICILLIN-POT CLAVULANATE 250-125 MG PO TABS
1.0000 | ORAL_TABLET | Freq: Two times a day (BID) | ORAL | Status: DC
  Administered 2013-01-30 – 2013-02-06 (×15): 1 via ORAL
  Filled 2013-01-29 (×17): qty 1

## 2013-01-29 MED ORDER — SODIUM CHLORIDE 0.9 % IV BOLUS (SEPSIS)
500.0000 mL | Freq: Once | INTRAVENOUS | Status: AC
  Administered 2013-01-29: 500 mL via INTRAVENOUS

## 2013-01-29 MED ORDER — INSULIN ASPART 100 UNIT/ML ~~LOC~~ SOLN
0.0000 [IU] | Freq: Three times a day (TID) | SUBCUTANEOUS | Status: DC
  Administered 2013-01-29 – 2013-01-30 (×2): 2 [IU] via SUBCUTANEOUS
  Administered 2013-01-30: 3 [IU] via SUBCUTANEOUS
  Administered 2013-01-31 – 2013-02-02 (×8): 2 [IU] via SUBCUTANEOUS
  Administered 2013-02-03: 5 [IU] via SUBCUTANEOUS
  Administered 2013-02-03 – 2013-02-04 (×3): 3 [IU] via SUBCUTANEOUS
  Administered 2013-02-04 – 2013-02-05 (×2): 2 [IU] via SUBCUTANEOUS
  Administered 2013-02-05 – 2013-02-06 (×2): 3 [IU] via SUBCUTANEOUS

## 2013-01-29 MED ORDER — POTASSIUM CHLORIDE IN NACL 20-0.45 MEQ/L-% IV SOLN
INTRAVENOUS | Status: DC
  Administered 2013-01-29 – 2013-01-30 (×3): via INTRAVENOUS
  Administered 2013-01-31: 1000 mL via INTRAVENOUS
  Administered 2013-01-31: 11:00:00 via INTRAVENOUS
  Administered 2013-01-31: 100 mL via INTRAVENOUS
  Filled 2013-01-29 (×13): qty 1000

## 2013-01-29 MED ORDER — BIOTENE DRY MOUTH MT LIQD
15.0000 mL | Freq: Two times a day (BID) | OROMUCOSAL | Status: DC
  Administered 2013-01-29 – 2013-02-02 (×8): 15 mL via OROMUCOSAL

## 2013-01-29 MED ORDER — AMOXICILLIN-POT CLAVULANATE 500-125 MG PO TABS
1.0000 | ORAL_TABLET | Freq: Once | ORAL | Status: AC
  Administered 2013-01-29: 500 mg via ORAL
  Filled 2013-01-29: qty 1

## 2013-01-29 MED ORDER — AMOXICILLIN-POT CLAVULANATE 875-125 MG PO TABS
1.0000 | ORAL_TABLET | Freq: Two times a day (BID) | ORAL | Status: DC
  Filled 2013-01-29: qty 1

## 2013-01-29 NOTE — Progress Notes (Signed)
eLink Nursing ICU Electrolyte Replacement Protocol  Patient Name: Emillio Ngo DOB: 1960/02/24 MRN: [id number]  Date of Service  01/29/2013   HPI/Events of Note    Recent Labs Lab 01/23/13 1830 01/24/13 0445  01/27/13 01/27/13 0400 01/28/13 0434 01/28/13 1500 01/29/13 0500  NA 130* 135  < > 140 142 146 147 148*  K 3.9 3.3*  < > 2.5* 3.2* 3.0* 3.3* 3.1*  CL 88* 94*  < > 99 98 101 102 104  CO2 28 31  < > 33* 32 31 30 31   GLUCOSE 393* 179*  < > 118* 219* 150* 125* 90  BUN 11 11  < > 11 12 34* 40* 44*  CREATININE 0.65 0.62  < > 0.73 0.71 2.19* 2.78* 3.46*  CALCIUM 8.3* 8.3*  < > 8.4 8.5 8.2* 8.3* 8.2*  MG  --  1.7  --   --  1.8  --   --   --   PHOS  --  3.7  --   --   --   --   --   --   < > = values in this interval not displayed.  Estimated Creatinine Clearance: 32.6 ml/min (by C-G formula based on Cr of 3.46).  Intake/Output     12/30 0701 - 12/31 0700   P.O. 240   I.V. (mL/kg) 1130.2 (10.4)   NG/GT 20   IV Piggyback 300   Total Intake(mL/kg) 1690.2 (15.5)   Urine (mL/kg/hr) 2800 (1.1)   Chest Tube 50 (0)   Total Output 2850   Net -1159.8        - I/O DETAILED x24h    Total I/O In: 790 [P.O.:240; I.V.:500; IV Piggyback:50] Out: 1150 [Urine:1150] - I/O THIS SHIFT    ASSESSMENT   eICURN Interventions  Pt K+ 3.3 Pt Does not fit ICU electrolyte Protocol. MD notified   ASSESSMENT: MAJOR ELECTROLYTE    Merita Norton 01/29/2013, 6:30 AM

## 2013-01-29 NOTE — Progress Notes (Signed)
Transferred in from [facility] by bed awake and alert.

## 2013-01-29 NOTE — Progress Notes (Signed)
PULMONARY / CRITICAL CARE MEDICINE  Name: Stephen Stanton MRN: [id number] DOB: [birth year]    ADMISSION DATE:  01/23/2013 CONSULTATION DATE:  01/23/2013  REFERRING MD :  HP EDP PRIMARY SERVICE:  PCCM  CHIEF COMPLAINT:  Shortness of breath  BRIEF PATIENT DESCRIPTION: 52 yo with past medical history of new onset diabetes ( brief admission one week ago) brought to [facility] ED with with dyspnea and fever.  In ED required BiPAP. Chest imaging demonstrated bilateral pneumonia and likely empyema on the R.  Transferred [facility] for further management.  SIGNIFICANT EVENTS / STUDIES:  12/25  Presented to [facility] ED with respiratory distress and fever 12/25  CTA chest: No central PE, extensive bilateral pneumonia R>L, large empyema with air fluid level on the right 12/25  Case discussed with TCTS >>> Chest tube by PCCM, TCTS will evaluate in am 12/25  Transferred to [facility] 12/25  Intubated, CVL placed, R tube thoracotomy  12/26  TTE >>> EF 50-55, no RWMA 12/26  Venous Doppler >>> no DVT 12/28  Chest CT >>> bilateral airspace disease, near complete resolution of bilateral pneumonia  LINES / TUBES: OETT 12/25 >>> 12/27 OGT 12/25 >>> 12/27 R IJ TLC 12/25 >>> Foley 12/25 >>> 12/27 R chest tube 12/25 >>> OETT 12/28>> 12/30  CULTURES: 12/25 MRSA >>> neg 12/25 Blood >>> 12/25 Urine >>> neg 12/25 Respiratory >>>yeast 12/25 Pleural fluid >>> GPC in pairs / GNB >>> microaerophilic strep  ANTIBIOTICS: Cefepime 12/25 x 1 Zosyn 12/25 >>>12/31 Vancomycin 12/25 >>> 12/30 Augmentin 12/31 >>  INTERVAL HISTORY:  He feels well, has been eating since he was extubated yesterday   VITAL SIGNS: Temp:  [98.6 F (37 C)-99.8 F (37.7 C)] 98.9 F (37.2 C) (12/31 0804) Pulse Rate:  [82-95] 95 (12/31 0900) Resp:  [16-39] 36 (12/31 0900) BP: (111-155)/(63-98) 125/78 mmHg (12/31 0900) SpO2:  [90 %-100 %] 97 % (12/31 0900) Weight:  [109.1 kg (240 lb 8.4 oz)] 109.1 kg (240 lb 8.4 oz) (12/31 0500)  HEMODYNAMICS:     VENTILATOR SETTINGS:   INTAKE / OUTPUT: Intake/Output     12/30 0701 - 12/31 0700 12/31 0701 - 01/01 0700   P.O. 240 360   I.V. (mL/kg) 1230.2 (11.3) 100 (0.9)   NG/GT 20    IV Piggyback 300    Total Intake(mL/kg) 1790.2 (16.4) 460 (4.2)   Urine (mL/kg/hr) 2800 (1.1)    Chest Tube 90 (0)    Total Output 2890     Net -1099.8 +460          PHYSICAL EXAMINATION: General:  Awake, conversant HEENT: NCAT, EOMi PULM: few crackles, R chest tube CV: RRR, no mgr AB: BS+, soft, nontender Ext: warm, improved ankle edema Neuro: Awake, alert on vent  LABS: CBC  Recent Labs Lab 01/27/13 0400 01/28/13 0434 01/29/13 0500  WBC 13.5* 10.4 12.2*  HGB 10.8* 9.4* 9.5*  HCT 33.8* 29.9* 30.4*  PLT 170 209 197   Coag's  Recent Labs Lab 01/23/13 1830  APTT 21*  INR 1.23   BMET  Recent Labs Lab 01/28/13 0434 01/28/13 1500 01/29/13 0500  NA 146 147 148*  K 3.0* 3.3* 3.1*  CL 101 102 104  CO2 31 30 31   BUN 34* 40* 44*  CREATININE 2.19* 2.78* 3.46*  GLUCOSE 150* 125* 90   Electrolytes  Recent Labs Lab 01/24/13 0445  01/27/13 0400 01/28/13 0434 01/28/13 1500 01/29/13 0500  CALCIUM 8.3*  < > 8.5 8.2* 8.3* 8.2*  MG 1.7  --  1.8  --   --   --  PHOS 3.7  --   --   --   --   --   < > = values in this interval not displayed. Sepsis Markers  Recent Labs Lab 01/23/13 2152 01/24/13 0445 01/24/13 1153 01/24/13 1745 01/25/13 0150 01/25/13 0405  LATICACIDVEN  --   --  1.5 1.4 1.3  --   PROCALCITON 18.36 21.56  --   --   --  14.78   ABG  Recent Labs Lab 01/24/13 0347 01/27/13 0233 01/28/13 0522  PHART 7.347* 7.420 7.474*  PCO2ART 57.0* 53.7* 41.6  PO2ART 75.0* 202.0* 81.5   Liver Enzymes  Recent Labs Lab 01/23/13 1410 01/23/13 1830  AST 52* 55*  ALT 52 52  ALKPHOS 97 88  BILITOT 0.4 0.6  ALBUMIN 2.1* 1.7*   Cardiac Enzymes  Recent Labs Lab 01/23/13 1410 01/23/13 1749 01/23/13 2220 01/24/13 0445  TROPONINI 0.44* 0.50* 0.42* 0.41*  PROBNP  488.1* 1140.0*  --   --    Glucose  Recent Labs Lab 01/28/13 1112 01/28/13 1617 01/28/13 1918 01/28/13 2338 01/29/13 0357 01/29/13 0759  GLUCAP 209* 107* 118* 92 89 80   CXR:  None today  ASSESSMENT / PLAN:  PULMONARY A:   HCAP (recent hospitalization).  Empyema > s/p chest tube and doing well (< 100cc output 12/30) Acute hypoxemic respiratory failure 12/28 presumably related to pulm edema> improving P: Incentive spiro Chest tube to water seal  CARDIOVASCULAR A:  Sepsis - resolved.  Diastolic dysfunction? >Normal LVF, but improved oxygenation with diuresis DVT ruled out. Afib with RVR > resolved P:  Hold lasix tele  RENAL A:   Non-oliguric AKI > due to over diuresis? FENa 10%, possibly related to lasix? Hypokalemia. P:   Gentle IVF today Check U/A Foley Replete K Repeat BMET  GASTROINTESTINAL A:  No active issues. P:   Diet as tolerated D/c PPI  HEMATOLOGIC A:  Mild anemia. P:  Trend CBC Heparin for DVT Px  INFECTIOUS A:  HCAP. Empyema. P:   D/c zosyn Augmentin, given empyema and severity of illness would complete a 21 day course  ENDOCRINE  A:  DM. Hyperglycemia. P:   SSI  ac&hs Lantus 20  NEUROLOGIC A:  Mild deconditioning P:   Ambulate today PT if needed  Family: patient updated at length 12/31 Code: Full  Dispo: Transfer to SDU, TRH service, PCCM to follow  Yolonda Kida PCCM Pager: [phone] Cell: [phone] If no response, call [phone]   01/29/2013, 10:03 AM

## 2013-01-30 ENCOUNTER — Inpatient Hospital Stay (HOSPITAL_COMMUNITY): Payer: 59

## 2013-01-30 DIAGNOSIS — N179 Acute kidney failure, unspecified: Secondary | ICD-10-CM | POA: Diagnosis not present

## 2013-01-30 DIAGNOSIS — I4891 Unspecified atrial fibrillation: Secondary | ICD-10-CM | POA: Diagnosis not present

## 2013-01-30 DIAGNOSIS — I10 Essential (primary) hypertension: Secondary | ICD-10-CM | POA: Diagnosis not present

## 2013-01-30 LAB — GLUCOSE, CAPILLARY
GLUCOSE-CAPILLARY: 144 mg/dL — AB (ref 70–99)
Glucose-Capillary: 72 mg/dL (ref 70–99)
Glucose-Capillary: 162 mg/dL — ABNORMAL HIGH (ref 70–99)
Glucose-Capillary: 139 mg/dL — ABNORMAL HIGH (ref 70–99)

## 2013-01-30 LAB — HEMOGLOBIN A1C
HEMOGLOBIN A1C: 14.2 % — AB (ref <5.7)
Mean Plasma Glucose: 361 mg/dL — ABNORMAL HIGH (ref <117)

## 2013-01-30 LAB — BASIC METABOLIC PANEL
BUN: 45 mg/dL — ABNORMAL HIGH (ref 6–23)
CO2: 27 mEq/L (ref 19–32)
Calcium: 8.4 mg/dL (ref 8.4–10.5)
Chloride: 102 mEq/L (ref 96–112)
Creatinine, Ser: 4.11 mg/dL — ABNORMAL HIGH (ref 0.50–1.35)
GFR calc Af Amer: 18 mL/min — ABNORMAL LOW (ref >90)
GFR calc non Af Amer: 16 mL/min — ABNORMAL LOW (ref >90)
Glucose, Bld: 83 mg/dL (ref 70–99)
Potassium: 3.2 mEq/L — ABNORMAL LOW (ref 3.7–5.3)
Sodium: 147 mEq/L (ref 137–147)

## 2013-01-30 LAB — CBC WITH DIFFERENTIAL/PLATELET
Basophils Absolute: 0 10*3/uL (ref 0.0–0.1)
Basophils Relative: 0 % (ref 0–1)
Eosinophils Absolute: 0.1 10*3/uL (ref 0.0–0.7)
Eosinophils Relative: 1 % (ref 0–5)
HCT: 31.3 % — ABNORMAL LOW (ref 39.0–52.0)
Hemoglobin: 10.1 g/dL — ABNORMAL LOW (ref 13.0–17.0)
Lymphocytes Relative: 14 % (ref 12–46)
Lymphs Abs: 1.9 10*3/uL (ref 0.7–4.0)
MCH: 27 pg (ref 26.0–34.0)
MCHC: 32.3 g/dL (ref 30.0–36.0)
MCV: 83.7 fL (ref 78.0–100.0)
Monocytes Absolute: 0.7 10*3/uL (ref 0.1–1.0)
Monocytes Relative: 5 % (ref 3–12)
Neutro Abs: 10.8 10*3/uL — ABNORMAL HIGH (ref 1.7–7.7)
Neutrophils Relative %: 80 % — ABNORMAL HIGH (ref 43–77)
Platelets: 267 10*3/uL (ref 150–400)
RBC: 3.74 MIL/uL — ABNORMAL LOW (ref 4.22–5.81)
RDW: 14 % (ref 11.5–15.5)
WBC: 13.5 10*3/uL — ABNORMAL HIGH (ref 4.0–10.5)

## 2013-01-30 MED ORDER — POTASSIUM CHLORIDE CRYS ER 20 MEQ PO TBCR
40.0000 meq | EXTENDED_RELEASE_TABLET | Freq: Once | ORAL | Status: AC
  Administered 2013-01-30: 40 meq via ORAL
  Filled 2013-01-30: qty 2

## 2013-01-30 NOTE — Progress Notes (Addendum)
[facility] Progress Note [place] TEAM 1 - Stepdown/ICU TEAM   Stephen Stanton ZOX:[id number] DOB: July 13, 1960 DOA: 01/23/2013 PCP: No PCP Per Patient    Brief narrative: 54 y/o with newly diagnosed DM who presented to the ER for shortness for breath which was of sudden onset along with a cough productive of yellow sputum. He was in [place] about a week prior and was diagnosed with DM in the ER and discharged home with Glyburide. Per the patient, he did not get admitted to the hospital but after d/c continued to feel weak. He later developed the above mentioned respiratory symptoms.  Subjective: Overall, doing well. No current dyspnea. Continues to have a productive cough. No significant chest pain.   Assessment/Plan: Principal Problem:    Acute respiratory failure requiring intubation /  HCAP/  Empyema - management of right chest tube per PCCM - cont Augmentin for microaerophilic streptococci cultured from pleural fluid for a total duration of 21 days - stop date 1/15 - Titrate O2 as able  Active Problems:    A-fib - a single episode of A-fib with RVR occuring in the ICU- appears to have received an Amiodarone load - ECHO reveals normal LV function with a mildly dilated right atrium - would hold off on chronic anticoagulation at this time as the episode was likely influenced by current respiratory issues    DM (diabetes mellitus) - recent diagnosis- obtain A1c - cont current dose of Lantus and sliding scale    Hypokalemia - replacing - f/u on Mg+ level    Acute renal failure - question ATN vs Prerenal-  -  Lasix given on and off since admission - noted to have had hypotension on 12/30 with BP dropping into 80s systolic  - also question if related to Vancomycin as level was noted to be 27.8 on 12/29 - now being hydrated and also urinating well - follow for now- check renal ultrasound in AM if level not improving     HTN (hypertension) - no prior diagnosis of  this - will follow for now    Code Status: Full code Family Communication: none Disposition Plan: follow in SDU until CT removed  Consultants: PCCM CT surgery  Procedures: 12/25- intubated- right Chest Tube placement  Antibiotics: Cefepime 12/25 x 1  Zosyn 12/25 >>>12/31  Vancomycin 12/25 >>> 12/30  Augmentin 12/31 >>   DVT prophylaxis: Heparin  Objective: Blood pressure 159/91, pulse 85, temperature 97.9 F (36.6 C), temperature source Oral, resp. rate 21, height 6\' 1"  (1.854 m), weight 110.1 kg (242 lb 11.6 oz), SpO2 96.00%.  Intake/Output Summary (Last 24 hours) at 01/30/13 1527 Last data filed at 01/30/13 1400  Gross per 24 hour  Intake   2980 ml  Output    947 ml  Net   2033 ml     Exam: General: No acute respiratory distress Lungs: bibasilar crackles  Cardiovascular: Regular rate and rhythm without murmur gallop or rub normal S1 and S2 Abdomen: Nontender, nondistended, soft, bowel sounds positive, no rebound, no ascites, no appreciable mass Extremities: No significant cyanosis, clubbing, or edema bilateral lower extremities  Data Reviewed: Basic Metabolic Panel:  Recent Labs Lab 01/23/13 1830 01/24/13 0445  01/27/13 0400 01/28/13 0434 01/28/13 1500 01/29/13 0500 01/29/13 1500 01/30/13 0500  NA 130* 135  < > 142 146 147 148* 145 147  K 3.9 3.3*  < > 3.2* 3.0* 3.3* 3.1* 3.3* 3.2*  CL 88* 94*  < > 98 101 102 104 102 102  CO2 28 31  < > 32 31 30 31 28 27   GLUCOSE 393* 179*  < > 219* 150* 125* 90 139* 83  BUN 11 11  < > 12 34* 40* 44* 46* 45*  CREATININE 0.65 0.62  < > 0.71 2.19* 2.78* 3.46* 3.83* 4.11*  CALCIUM 8.3* 8.3*  < > 8.5 8.2* 8.3* 8.2* 8.2* 8.4  MG  --  1.7  --  1.8  --   --   --   --   --   PHOS  --  3.7  --   --   --   --   --   --   --   < > = values in this interval not displayed. Liver Function Tests:  Recent Labs Lab 01/23/13 1830  AST 55*  ALT 52  ALKPHOS 88  BILITOT 0.6  PROT 7.4  ALBUMIN 1.7*    Recent Labs Lab  01/23/13 1830  LIPASE 18  AMYLASE 26   No results found for this basename: AMMONIA,  in the last 168 hours CBC:  Recent Labs Lab 01/26/13 0937 01/27/13 0400 01/28/13 0434 01/29/13 0500 01/30/13 0500  WBC 11.7* 13.5* 10.4 12.2* 13.5*  NEUTROABS  --   --   --  9.9* 10.8*  HGB 10.3* 10.8* 9.4* 9.5* 10.1*  HCT 32.2* 33.8* 29.9* 30.4* 31.3*  MCV 83.6 84.1 86.2 84.0 83.7  PLT 171 170 209 197 267   Cardiac Enzymes:  Recent Labs Lab 01/23/13 1749 01/23/13 2220 01/24/13 0445  TROPONINI 0.50* 0.42* 0.41*   BNP (last 3 results)  Recent Labs  01/23/13 1410 01/23/13 1749  PROBNP 488.1* 1140.0*   CBG:  Recent Labs Lab 01/29/13 1145 01/29/13 1558 01/29/13 2133 01/30/13 0839 01/30/13 1153  GLUCAP 142* 121* 114* 72 162*    Recent Results (from the past 240 hour(s))  CULTURE, BLOOD (ROUTINE X 2)     Status: None   Collection Time    01/23/13  3:00 PM      Result Value Range Status   Specimen Description BLOOD LEFT HAND   Final   Special Requests NONE BOTTLES DRAWN AEROBIC ONLY 5CC ONLY   Final   Culture  Setup Time     Final   Value: 01/23/2013 22:10     Performed at [facility]   Culture     Final   Value: NO GROWTH 5 DAYS     Performed at [facility]   Report Status 01/29/2013 FINAL   Final  CULTURE, BLOOD (ROUTINE X 2)     Status: None   Collection Time    01/23/13  3:00 PM      Result Value Range Status   Specimen Description BLOOD RIGHT HAND   Final   Special Requests NONE BOTTLES DRAWN AEROBIC AND ANAEROBIC 5CC EACH   Final   Culture  Setup Time     Final   Value: 01/23/2013 22:10     Performed at [facility]   Culture     Final   Value: NO GROWTH 5 DAYS     Performed at [facility]   Report Status 01/29/2013 FINAL   Final  URINE CULTURE     Status: None   Collection Time    01/23/13  9:54 PM      Result Value Range Status   Specimen Description URINE, CATHETERIZED   Final   Special Requests NONE   Final    Culture  Setup Time  Final   Value: 01/24/2013 08:48     Performed at [facility] Count     Final   Value: NO GROWTH     Performed at [facility]   Culture     Final   Value: NO GROWTH     Performed at [facility]   Report Status 01/25/2013 FINAL   Final  BODY FLUID CULTURE     Status: None   Collection Time    01/23/13  9:54 PM      Result Value Range Status   Specimen Description PLEURAL FLUID   Final   Special Requests NONE   Final   Gram Stain     Final   Value: ABUNDANT WBC PRESENT,BOTH PMN AND MONONUCLEAR     MODERATE GRAM POSITIVE COCCI     IN PAIRS FEW GRAM NEGATIVE RODS     Gram Stain Report Called to,Read Back By and Verified With: Gram Stain Report Called to,Read Back By and Verified With: Linton Flemings RN 01/24/13 1040AM BY MILSH     Performed at [facility]   Culture     Final   Value: MODERATE MICROAEROPHILIC STREPTOCOCCI     Note: Standardized susceptibility testing for this organism is not available.     Performed at [facility]   Report Status 01/27/2013 FINAL   Final  CULTURE, RESPIRATORY (NON-EXPECTORATED)     Status: None   Collection Time    01/24/13 12:20 AM      Result Value Range Status   Specimen Description TRACHEAL ASPIRATE   Final   Special Requests NONE   Final   Gram Stain     Final   Value: FEW WBC PRESENT,BOTH PMN AND MONONUCLEAR     NO SQUAMOUS EPITHELIAL CELLS SEEN     NO ORGANISMS SEEN     Performed at [facility]   Culture     Final   Value: FEW YEAST CONSISTENT WITH CANDIDA SPECIES     Performed at [facility]   Report Status 01/26/2013 FINAL   Final  MRSA PCR SCREENING     Status: None   Collection Time    01/24/13  7:14 AM      Result Value Range Status   MRSA by PCR NEGATIVE  NEGATIVE Final   Comment:            The GeneXpert MRSA Assay (FDA     approved for NASAL specimens     only), is one component of a     comprehensive MRSA colonization      surveillance program. It is not     intended to diagnose MRSA     infection nor to guide or     monitor treatment for     MRSA infections.     Studies:  Recent x-ray studies have been reviewed in detail by the Attending Physician  Scheduled Meds:  Scheduled Meds: . amoxicillin-clavulanate  1 tablet Oral Q12H  . antiseptic oral rinse  15 mL Mouth Rinse BID  . heparin subcutaneous  5,000 Units Subcutaneous Q8H  . insulin aspart  0-15 Units Subcutaneous TID WC  . insulin glargine  30 Units Subcutaneous Daily   Continuous Infusions: . 0.45 % NaCl with KCl 20 mEq / L 100 mL/hr at 01/30/13 0330    Time spent on care of this patient: 35 min   [facility]  [facility] Office  [phone] Pager - Text Page per  Amion as per below:  On-Call/Text Page:      [email or web address]      password [email or web address]  If 7PM-7AM, please contact night-coverage [email or web address] Password [email or web address] 01/30/2013, 3:27 PM   LOS: 7 days

## 2013-01-31 ENCOUNTER — Inpatient Hospital Stay (HOSPITAL_COMMUNITY): Payer: 59

## 2013-01-31 LAB — GLUCOSE, CAPILLARY
GLUCOSE-CAPILLARY: 161 mg/dL — AB (ref 70–99)
Glucose-Capillary: 123 mg/dL — ABNORMAL HIGH (ref 70–99)
Glucose-Capillary: 150 mg/dL — ABNORMAL HIGH (ref 70–99)
Glucose-Capillary: 144 mg/dL — ABNORMAL HIGH (ref 70–99)

## 2013-01-31 LAB — BASIC METABOLIC PANEL
BUN: 43 mg/dL — ABNORMAL HIGH (ref 6–23)
CO2: 27 meq/L (ref 19–32)
Calcium: 7.9 mg/dL — ABNORMAL LOW (ref 8.4–10.5)
Chloride: 105 mEq/L (ref 96–112)
Creatinine, Ser: 4.16 mg/dL — ABNORMAL HIGH (ref 0.50–1.35)
GFR calc Af Amer: 18 mL/min — ABNORMAL LOW (ref >90)
GFR, EST NON AFRICAN AMERICAN: 15 mL/min — AB (ref >90)
GLUCOSE: 167 mg/dL — AB (ref 70–99)
Potassium: 3.3 mEq/L — ABNORMAL LOW (ref 3.7–5.3)
SODIUM: 144 meq/L (ref 137–147)

## 2013-01-31 LAB — CBC
HCT: 29 % — ABNORMAL LOW (ref 39.0–52.0)
Hemoglobin: 8.9 g/dL — ABNORMAL LOW (ref 13.0–17.0)
MCH: 26.1 pg (ref 26.0–34.0)
MCHC: 30.7 g/dL (ref 30.0–36.0)
MCV: 85 fL (ref 78.0–100.0)
Platelets: 270 10*3/uL (ref 150–400)
RBC: 3.41 MIL/uL — ABNORMAL LOW (ref 4.22–5.81)
RDW: 13.9 % (ref 11.5–15.5)
WBC: 11.5 10*3/uL — ABNORMAL HIGH (ref 4.0–10.5)

## 2013-01-31 LAB — MAGNESIUM: Magnesium: 2 mg/dL (ref 1.5–2.5)

## 2013-01-31 MED ORDER — LIVING WELL WITH DIABETES BOOK
Freq: Once | Status: DC
  Filled 2013-01-31: qty 1

## 2013-01-31 MED ORDER — BD GETTING STARTED TAKE HOME KIT: 1/2ML X 30G SYRINGES
1.0000 | Freq: Once | Status: DC
  Filled 2013-01-31: qty 1

## 2013-01-31 MED ORDER — POTASSIUM CHLORIDE CRYS ER 20 MEQ PO TBCR
40.0000 meq | EXTENDED_RELEASE_TABLET | Freq: Once | ORAL | Status: AC
  Administered 2013-01-31: 40 meq via ORAL
  Filled 2013-01-31: qty 4

## 2013-01-31 MED ORDER — "BD GETTING STARTED TAKE HOME KIT: 1ML X 30 G SYRINGES, "
1.0000 | Freq: Once | Status: DC
  Filled 2013-01-31: qty 1

## 2013-01-31 NOTE — Plan of Care (Signed)
Problem: Phase III Progression Outcomes Goal: Other Phase III Outcomes/Goals Outcome: Progressing Living Well with diabetes Booklet given

## 2013-01-31 NOTE — Progress Notes (Signed)
PULMONARY / CRITICAL CARE MEDICINE  Name: Stephen Stanton MRN: [id number] DOB: 05/31/1960    ADMISSION DATE:  01/23/2013 CONSULTATION DATE:  01/23/2013  REFERRING MD :  HP EDP PRIMARY SERVICE:  PCCM  CHIEF COMPLAINT:  Shortness of breath  BRIEF PATIENT DESCRIPTION: 53 yo with past medical history of new onset diabetes ( brief admission one week ago) brought to [facility] ED with with dyspnea and fever.  In ED required BiPAP. Chest imaging demonstrated bilateral pneumonia and likely empyema on the R.  Transferred [facility] for further management.  SIGNIFICANT EVENTS / STUDIES:  12/25  Presented to [facility] ED with respiratory distress and fever 12/25  CTA chest: No central PE, extensive bilateral pneumonia R>L, large empyema with air fluid level on the right 12/25  Case discussed with TCTS >>> Chest tube by PCCM, TCTS will evaluate in am 12/25  Transferred to [facility] 12/25  Intubated, CVL placed, R tube thoracotomy  12/26  TTE >>> EF 50-55, no RWMA 12/26  Venous Doppler >>> no DVT 12/28  Chest CT >>> bilateral airspace disease, near complete resolution of bilateral pneumonia  LINES / TUBES: OETT 12/25 >>> 12/27 OGT 12/25 >>> 12/27 R IJ TLC 12/25 >>> Foley 12/25 >>> 12/27 R chest tube 12/25 >>> OETT 12/28>> 12/30  CULTURES: 12/25 MRSA >>> neg 12/25 Blood >>> 12/25 Urine >>> neg 12/25 Respiratory >>>yeast 12/25 Pleural fluid >>> GPC in pairs / GNB >>> microaerophilic strep  ANTIBIOTICS: Cefepime 12/25 x 1 Zosyn 12/25 >>>12/31 Vancomycin 12/25 >>> 12/30 Augmentin 12/31 >>  INTERVAL HISTORY:  He feels well, no distress.    VITAL SIGNS: Temp:  [98.3 F (36.8 C)-98.9 F (37.2 C)] 98.8 F (37.1 C) (01/02 1155) Pulse Rate:  [69-80] 69 (01/02 1155) Resp:  [23-31] 28 (01/02 0500) BP: (141-168)/(80-103) 141/97 mmHg (01/02 1155) SpO2:  [96 %-99 %] 99 % (01/02 1155) Weight:  [111.9 kg (246 lb 11.1 oz)] 111.9 kg (246 lb 11.1 oz) (01/02 0500) Room air  HEMODYNAMICS:    VENTILATOR SETTINGS:    INTAKE / OUTPUT: Intake/Output     01/01 0701 - 01/02 0700 01/02 0701 - 01/03 0700   P.O. 640    I.V. (mL/kg) 2300 (20.6)    IV Piggyback     Total Intake(mL/kg) 2940 (26.3)    Urine (mL/kg/hr) 1225 (0.5) 600 (0.7)   Stool     Chest Tube 10 (0)    Total Output 1235 600   Net +1705 -600        Urine Occurrence 1 x    Stool Occurrence 1 x      PHYSICAL EXAMINATION: General:  Awake, conversant HEENT: NCAT, EOMi PULM: few crackles, R chest tube, no sig output, no airleak  CV: RRR, no mgr AB: BS+, soft, nontender Ext: warm, improved ankle edema Neuro: Awake, alert on vent  LABS: CBC  Recent Labs Lab 01/29/13 0500 01/30/13 0500 01/31/13 0500  WBC 12.2* 13.5* 11.5*  HGB 9.5* 10.1* 8.9*  HCT 30.4* 31.3* 29.0*  PLT 197 267 270   Coag's No results found for this basename: APTT, INR,  in the last 168 hours BMET  Recent Labs Lab 01/29/13 1500 01/30/13 0500 01/31/13 0500  NA 145 147 144  K 3.3* 3.2* 3.3*  CL 102 102 105  CO2 28 27 27   BUN 46* 45* 43*  CREATININE 3.83* 4.11* 4.16*  GLUCOSE 139* 83 167*   Electrolytes  Recent Labs Lab 01/27/13 0400  01/29/13 1500 01/30/13 0500 01/31/13 0500  CALCIUM 8.5  < >  8.2* 8.4 7.9*  MG 1.8  --   --   --  2.0  < > = values in this interval not displayed. Sepsis Markers  Recent Labs Lab 01/24/13 1745 01/25/13 0150 01/25/13 0405  LATICACIDVEN 1.4 1.3  --   PROCALCITON  --   --  14.78   ABG  Recent Labs Lab 01/27/13 0233 01/28/13 0522  PHART 7.420 7.474*  PCO2ART 53.7* 41.6  PO2ART 202.0* 81.5   Liver Enzymes No results found for this basename: AST, ALT, ALKPHOS, BILITOT, ALBUMIN,  in the last 168 hours Cardiac Enzymes No results found for this basename: TROPONINI, PROBNP,  in the last 168 hours Glucose  Recent Labs Lab 01/30/13 0839 01/30/13 1153 01/30/13 1633 01/30/13 2134 01/31/13 0815 01/31/13 1155  GLUCAP 72 162* 144* 139* 123* 150*   CXR: bibasilar airspace disease. Right Chest tube in  good position. No sig residual effusion. No PTX   ASSESSMENT / PLAN:  PULMONARY A:   HCAP (recent hospitalization). Empyema > s/p chest tube and doing well (< 20 ml output in >24hrs) Acute hypoxemic respiratory failure 12/28 presumably related to pulm edema> improving, now on room air, residual airspace disease will take weeks to clear P: Incentive spirometry  D/c Chest tube Repeat CXR 1/3 and then PRN See ID section   CARDIOVASCULAR A:  Sepsis - resolved.  Diastolic dysfunction? >Normal LVF, but improved oxygenation with diuresis DVT ruled out. Afib with RVR > resolved P:  Hold lasix tele  RENAL A:   Non-oliguric AKI > due to over diuresis? FENa 10%, possibly related to lasix? Scr a little worse Hypokalemia. P:   IVFs @ 100 Replete K Repeat BMET  GASTROINTESTINAL A:  No active issues. P:   Diet as tolerated D/c PPI  HEMATOLOGIC A:  Mild anemia. P:  Trend CBC Heparin for DVT Px  INFECTIOUS A:  HCAP. Empyema. P:   Augmentin, given empyema and severity of illness would complete a 21 day course  ENDOCRINE  A:  DM. Hyperglycemia. P:   SSI  ac&hs Lantus 30  NEUROLOGIC A:  Mild deconditioning P:   Ambulate today PT if needed  BABCOCK,PETE 01/31/2013, 2:52 PM  I have personally obtained history, examined patient, evaluated and interpreted laboratory and imaging results, reviewed medical records, formulated assessment / plan and placed orders.  Stephen Stanton, Stephen Hornback, MD Pulmonary and Critical Care Medicine [facility] Pager: [phone]  01/31/2013, 3:40 PM

## 2013-01-31 NOTE — Progress Notes (Addendum)
Inpatient Diabetes Program Recommendations  AACE/ADA: New Consensus Statement on Inpatient Glycemic Control (2013)  Target Ranges:  Prepandial:   less than 140 mg/dL      Peak postprandial:   less than 180 mg/dL (1-2 hours)      Critically ill patients:  140 - 180 mg/dL   Reason for Assessment: New insulin requiring DM  53 y/o with newly diagnosed DM who presented to the ER for shortness for breath which was of sudden onset along with a cough productive of yellow sputum. He was in [place] about a week prior and was diagnosed with DM in the ER and discharged home with Glyburide.   Results for [name], [name] (MRN [id number]) as of 01/31/2013 18:19  Ref. Range 01/30/2013 12:40  Hemoglobin A1C Latest Range: <5.7 % 14.2 (H)  Results for [name], [name] (MRN [id number]) as of 01/31/2013 18:19  Ref. Range 01/30/2013 08:39 01/30/2013 11:53 01/30/2013 16:33 01/30/2013 21:34 01/31/2013 08:15 01/31/2013 11:55 01/31/2013 16:34  Glucose-Capillary Latest Range: 70-99 mg/dL 72 162 (H) 144 (H) 139 (H) 123 (H) 150 (H) 144 (H)  Blood sugars within target range for hospitalized pts. Will need to go home on insulin. Will order Living Well With Diabetes book, RN to assist pt in viewing diabetes videos on pt ed channel ([phone]),   Insulin Starter Kit ordered for RN to teach insulin administration.  Continue with Lantus 30 units QAM. Consider addition of Novolog meal coverage insulin when pt is discharged. Novolog 8 units tidwc if pt eats adequate meal. OP Diabetes Education consult ordered for uncontrolled DM. Will need prescription for meter, strips and lancets at discharge.  Thank you. Lorenda Peck, RD, LDN, CDE Inpatient Diabetes Coordinator [phone]

## 2013-01-31 NOTE — Progress Notes (Signed)
Right pleural tube removed per protocol.  Noted purulent yellowish drainage from CT incision.  No foul odor.  Pt tolerated procedure well.  Clear and equal bilateral breath sounds auscultated post-removal.  Pt's RN updated.

## 2013-01-31 NOTE — Consult Note (Signed)
53 yo with past medical history of new onset diabetes  brought to [facility] ED with with dyspnea and fever. In ED required BiPAP. Chest imaging demonstrated bilateral pneumonia and likely empyema on the R. Transferred [facility] for further management. On 12/25 had CTA and chest tube placed and intubated 12/29 for airway protection. Around 12/30 had afib with RVR.  There was significant and persistent hypotension on 12/30 and 12/29.  Results for Stephen Stanton, Stephen Stanton (MRN [id number]) as of 01/31/2013 15:57  Ref. Range 01/24/2013 04:45 01/25/2013 04:05 01/26/2013 09:37 01/27/2013 00:00 01/27/2013 04:00 01/28/2013 04:34 01/28/2013 15:00 01/29/2013 05:00 01/29/2013 15:00 01/30/2013 05:00 01/31/2013 05:00  Creatinine Latest Range: 0.50-1.35 mg/dL 0.62 0.68 0.72 0.73 0.71 2.19 (H) 2.78 (H) 3.46 (H) 3.83 (H) 4.11 (H) 4.16 (H)   Renal consultation requested to assist with management.  Past Medical History  Diagnosis Date  . Diabetes mellitus without complication    History reviewed. No pertinent past surgical history. Social History:  reports that he has quit smoking. He does not have any smokeless tobacco history on file. He reports that he does not drink alcohol or use illicit drugs. Allergies: No Known Allergies History reviewed. No pertinent family history.  Medications:  Scheduled: . amoxicillin-clavulanate  1 tablet Oral Q12H  . antiseptic oral rinse  15 mL Mouth Rinse BID  . heparin subcutaneous  5,000 Units Subcutaneous Q8H  . insulin aspart  0-15 Units Subcutaneous TID WC  . insulin glargine  30 Units Subcutaneous Daily   ROS: Denies prior voiding difficulties. Nocturia x2 only.  Blood pressure 141/97, pulse 69, temperature 98.8 F (37.1 C), temperature source Oral, resp. rate 28, height 6' 1"  (1.854 m), weight 111.9 kg (246 lb 11.1 oz), SpO2 99.00%.  General appearance: alert and cooperative Head: Normocephalic, without obvious abnormality, atraumatic Eyes: negative Ears: normal TM's and external ear canals  both ears Nose: Nares normal. Septum midline. Mucosa normal. No drainage or sinus tenderness. Throat: lips, mucosa, and tongue normal; teeth and gums normal Resp: diminished breath sounds RLL Chest wall: right lat chest tube Cardio: regular rate and rhythm, S1, S2 normal, no murmur, click, rub or gallop GI: soft, non-tender; bowel sounds normal; no masses,  no organomegaly Extremities: edema tr to 1= Skin: Skin color, texture, turgor normal. No rashes or lesions Neurologic: Grossly normal Results for orders placed during the hospital encounter of 01/23/13 (from the past 48 hour(s))  GLUCOSE, CAPILLARY     Status: Abnormal   Collection Time    01/29/13  9:33 PM      Result Value Range   Glucose-Capillary 114 (*) 70 - 99 mg/dL  BASIC METABOLIC PANEL     Status: Abnormal   Collection Time    01/30/13  5:00 AM      Result Value Range   Sodium 147  137 - 147 mEq/L   Comment: Please note change in reference range.   Potassium 3.2 (*) 3.7 - 5.3 mEq/L   Comment: Please note change in reference range.   Chloride 102  96 - 112 mEq/L   CO2 27  19 - 32 mEq/L   Glucose, Bld 83  70 - 99 mg/dL   BUN 45 (*) 6 - 23 mg/dL   Creatinine, Ser 4.11 (*) 0.50 - 1.35 mg/dL   Calcium 8.4  8.4 - 10.5 mg/dL   GFR calc non Af Amer 16 (*) >90 mL/min   GFR calc Af Amer 18 (*) >90 mL/min   Comment: (NOTE)     The eGFR has been  calculated using the CKD EPI equation.     This calculation has not been validated in all clinical situations.     eGFR's persistently <90 mL/min signify possible Chronic Kidney     Disease.  CBC WITH DIFFERENTIAL     Status: Abnormal   Collection Time    01/30/13  5:00 AM      Result Value Range   WBC 13.5 (*) 4.0 - 10.5 K/uL   RBC 3.74 (*) 4.22 - 5.81 MIL/uL   Hemoglobin 10.1 (*) 13.0 - 17.0 g/dL   HCT 31.3 (*) 39.0 - 52.0 %   MCV 83.7  78.0 - 100.0 fL   MCH 27.0  26.0 - 34.0 pg   MCHC 32.3  30.0 - 36.0 g/dL   RDW 14.0  11.5 - 15.5 %   Platelets 267  150 - 400 K/uL    Comment: DELTA CHECK NOTED     REPEATED TO VERIFY   Neutrophils Relative % 80 (*) 43 - 77 %   Lymphocytes Relative 14  12 - 46 %   Monocytes Relative 5  3 - 12 %   Eosinophils Relative 1  0 - 5 %   Basophils Relative 0  0 - 1 %   Neutro Abs 10.8 (*) 1.7 - 7.7 K/uL   Lymphs Abs 1.9  0.7 - 4.0 K/uL   Monocytes Absolute 0.7  0.1 - 1.0 K/uL   Eosinophils Absolute 0.1  0.0 - 0.7 K/uL   Basophils Absolute 0.0  0.0 - 0.1 K/uL  GLUCOSE, CAPILLARY     Status: None   Collection Time    01/30/13  8:39 AM      Result Value Range   Glucose-Capillary 72  70 - 99 mg/dL  GLUCOSE, CAPILLARY     Status: Abnormal   Collection Time    01/30/13 11:53 AM      Result Value Range   Glucose-Capillary 162 (*) 70 - 99 mg/dL  HEMOGLOBIN A1C     Status: Abnormal   Collection Time    01/30/13 12:40 PM      Result Value Range   Hemoglobin A1C 14.2 (*) <5.7 %   Comment: (NOTE)                                                                               According to the ADA Clinical Practice Recommendations for 2011, when     HbA1c is used as a screening test:      >=6.5%   Diagnostic of Diabetes Mellitus               (if abnormal result is confirmed)     5.7-6.4%   Increased risk of developing Diabetes Mellitus     References:Diagnosis and Classification of Diabetes Mellitus,Diabetes     XBDZ,3299,24(QASTM 1):S62-S69 and Standards of Medical Care in             Diabetes - 2011,Diabetes Care,2011,34 (Suppl 1):S11-S61.   Mean Plasma Glucose 361 (*) <117 mg/dL   Comment: Performed at [facility], CAPILLARY     Status: Abnormal   Collection Time    01/30/13  4:33 PM      Result  Value Range   Glucose-Capillary 144 (*) 70 - 99 mg/dL  GLUCOSE, CAPILLARY     Status: Abnormal   Collection Time    01/30/13  9:34 PM      Result Value Range   Glucose-Capillary 139 (*) 70 - 99 mg/dL   Comment 1 Documented in Chart     Comment 2 Notify RN    BASIC METABOLIC PANEL     Status: Abnormal    Collection Time    01/31/13  5:00 AM      Result Value Range   Sodium 144  137 - 147 mEq/L   Comment: Please note change in reference range.   Potassium 3.3 (*) 3.7 - 5.3 mEq/L   Comment: Please note change in reference range.   Chloride 105  96 - 112 mEq/L   CO2 27  19 - 32 mEq/L   Glucose, Bld 167 (*) 70 - 99 mg/dL   BUN 43 (*) 6 - 23 mg/dL   Creatinine, Ser 4.16 (*) 0.50 - 1.35 mg/dL   Calcium 7.9 (*) 8.4 - 10.5 mg/dL   GFR calc non Af Amer 15 (*) >90 mL/min   GFR calc Af Amer 18 (*) >90 mL/min   Comment: (NOTE)     The eGFR has been calculated using the CKD EPI equation.     This calculation has not been validated in all clinical situations.     eGFR's persistently <90 mL/min signify possible Chronic Kidney     Disease.  CBC     Status: Abnormal   Collection Time    01/31/13  5:00 AM      Result Value Range   WBC 11.5 (*) 4.0 - 10.5 K/uL   RBC 3.41 (*) 4.22 - 5.81 MIL/uL   Hemoglobin 8.9 (*) 13.0 - 17.0 g/dL   HCT 29.0 (*) 39.0 - 52.0 %   MCV 85.0  78.0 - 100.0 fL   MCH 26.1  26.0 - 34.0 pg   MCHC 30.7  30.0 - 36.0 g/dL   RDW 13.9  11.5 - 15.5 %   Platelets 270  150 - 400 K/uL  MAGNESIUM     Status: None   Collection Time    01/31/13  5:00 AM      Result Value Range   Magnesium 2.0  1.5 - 2.5 mg/dL  GLUCOSE, CAPILLARY     Status: Abnormal   Collection Time    01/31/13  8:15 AM      Result Value Range   Glucose-Capillary 123 (*) 70 - 99 mg/dL  GLUCOSE, CAPILLARY     Status: Abnormal   Collection Time    01/31/13 11:55 AM      Result Value Range   Glucose-Capillary 150 (*) 70 - 99 mg/dL   US Renal  01/31/2013   CLINICAL DATA:  Renal failure.  EXAM: RENAL/URINARY TRACT ULTRASOUND COMPLETE  COMPARISON:  None.  FINDINGS: Right Kidney:  Length: 13.7 cm. Right kidney is diffusely hyperechoic consistent with chronic medical renal disease. No hydronephrosis. No mass.  Left Kidney:  Length: 14.5 cm. Left kidney is diffusely hyperechoic consistent with chronic medical renal  disease. No hydronephrosis. No mass.  Bladder:  Appears normal for degree of bladder distention.  IMPRESSION: Both kidneys are hyperechoic suggesting chronic medical renal disease. No hydronephrosis. No bladder distention.   Electronically Signed   By: Marcello Moores  Register   On: 01/31/2013 10:28   Dg Chest Port 1 View  01/30/2013   CLINICAL DATA:  Pneumonia.  Right chest tube  EXAM: PORTABLE CHEST - 1 VIEW  COMPARISON:  Chest radiograph 01/29/2013 and CT chest 01/26/2013  FINDINGS: Right IJ central venous catheter is unchanged, projecting over the mid superior vena cava. A right chest tube projects over the right lung base, stable. Heart appears enlarged and the cardiomediastinal silhouette is stable.  Extensive bilateral airspace disease with areas of consolidation appears without significant change compared to chest radiograph of 01/29/2013. There is some sparing of the lung apices. No definite visible pleural effusion.  IMPRESSION: No significant change in bilateral, multilobar pneumonia compared to chest radiograph of 01/29/2013.   Electronically Signed   By: Curlene Dolphin M.D.   On: 01/30/2013 08:23    Assessment:  1 AKI, hemodynamically mediated due to hypotension Plan: 1 Supportive therapy for now.  Stephen Stanton C 01/31/2013, 4:11 PM

## 2013-01-31 NOTE — Progress Notes (Signed)
[facility] Progress Note [place] TEAM 1 - Stepdown/ICU TEAM   Stephen Stanton ZOX:[id number] DOB: Jun 02, 1960 DOA: 01/23/2013 PCP: No PCP Per Patient    Brief narrative: 53 y/o with newly diagnosed DM who presented to the ER for shortness for breath which was of sudden onset along with a cough productive of yellow sputum. He was in [place] about a week prior and was diagnosed with DM in the ER and discharged home with Glyburide. Per the patient, he did not get admitted to the hospital but after d/c continued to feel weak. He later developed the above mentioned respiratory symptoms.  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 for a total duration of 21 days - stop date 1/15 - Titrate O2 as able  Active Problems:    A-fib - a single episode of A-fib with RVR occuring in the ICU- appears to have received an Amiodarone load - ECHO reveals normal LV function with a mildly dilated right atrium - would hold off on chronic anticoagulation at this time as the episode was likely influenced by current respiratory issues    DM (diabetes mellitus) - recent diagnosis- A1c is 14.2 - cont current dose of Lantus and sliding scale - start insulin teaching, consult Diabetes coordinator     Hypokalemia - replacing -  Mg+ level normal    Acute renal failure - question ATN vs Prerenal-  -  Lasix given on and off since admission - noted to have had hypotension on 12/30 with BP dropping into 80s systolic  - also question if related to Vancomycin as level was noted to be 27.8 on 12/29 - now being hydrated and also urinating well - renal ultrasound suggests chronic medical renal disease    HTN (hypertension) - no prior diagnosis of this - will follow trend for now    Code  Status: Full code Family Communication: none Disposition Plan: follow in SDU until CT removed  Consultants: PCCM CT surgery  Procedures: 12/25- intubated- right Chest Tube placement  Antibiotics: Cefepime 12/25 x 1  Zosyn 12/25 >>>12/31  Vancomycin 12/25 >>> 12/30  Augmentin 12/31 >>   DVT prophylaxis: Heparin  Objective: Blood pressure 141/97, pulse 69, temperature 98.8 F (37.1 C), temperature source Oral, resp. rate 28, height 6\' 1"  (1.854 m), weight 111.9 kg (246 lb 11.1 oz), SpO2 99.00%.  Intake/Output Summary (Last 24 hours) at 01/31/13 1403 Last data filed at 01/31/13 0900  Gross per 24 hour  Intake   1840 ml  Output    810 ml  Net   1030 ml     Exam: General: No acute respiratory distress Lungs: crackles in right lung field Cardiovascular: Regular rate and rhythm without murmur gallop or rub normal S1 and S2 Abdomen: Nontender, nondistended, soft, bowel sounds positive, no rebound, no ascites, no appreciable mass Extremities: No significant cyanosis, clubbing, or edema bilateral lower extremities  Data Reviewed: Basic Metabolic Panel:  Recent Labs Lab 01/27/13 0400  01/28/13 1500 01/29/13 0500 01/29/13 1500 01/30/13 0500 01/31/13 0500  NA 142  < > 147 148* 145 147 144  K 3.2*  < > 3.3* 3.1* 3.3* 3.2* 3.3*  CL 98  < > 102 104 102 102 105  CO2 32  < > 30 31 28 27 27   GLUCOSE 219*  < > 125* 90  139* 83 167*  BUN 12  < > 40* 44* 46* 45* 43*  CREATININE 0.71  < > 2.78* 3.46* 3.83* 4.11* 4.16*  CALCIUM 8.5  < > 8.3* 8.2* 8.2* 8.4 7.9*  MG 1.8  --   --   --   --   --  2.0  < > = values in this interval not displayed. Liver Function Tests: No results found for this basename: AST, ALT, ALKPHOS, BILITOT, PROT, ALBUMIN,  in the last 168 hours No results found for this basename: LIPASE, AMYLASE,  in the last 168 hours No results found for this basename: AMMONIA,  in the last 168 hours CBC:  Recent Labs Lab 01/27/13 0400 01/28/13 0434 01/29/13 0500  01/30/13 0500 01/31/13 0500  WBC 13.5* 10.4 12.2* 13.5* 11.5*  NEUTROABS  --   --  9.9* 10.8*  --   HGB 10.8* 9.4* 9.5* 10.1* 8.9*  HCT 33.8* 29.9* 30.4* 31.3* 29.0*  MCV 84.1 86.2 84.0 83.7 85.0  PLT 170 209 197 267 270   Cardiac Enzymes: No results found for this basename: CKTOTAL, CKMB, CKMBINDEX, TROPONINI,  in the last 168 hours BNP (last 3 results)  Recent Labs  01/23/13 1410 01/23/13 1749  PROBNP 488.1* 1140.0*   CBG:  Recent Labs Lab 01/30/13 1153 01/30/13 1633 01/30/13 2134 01/31/13 0815 01/31/13 1155  GLUCAP 162* 144* 139* 123* 150*    Recent Results (from the past 240 hour(s))  CULTURE, BLOOD (ROUTINE X 2)     Status: None   Collection Time    01/23/13  3:00 PM      Result Value Range Status   Specimen Description BLOOD LEFT HAND   Final   Special Requests NONE BOTTLES DRAWN AEROBIC ONLY 5CC ONLY   Final   Culture  Setup Time     Final   Value: 01/23/2013 22:10     [facility]formed [facility]   Culture     Final   Value: NO GROWTH 5 DAYS     Performed at [facility]   Report Status 01/29/2013 FINAL   Final  CULTURE, BLOOD (ROUTINE X 2)     Status: None   Collection Time    01/23/13  3:00 PM      Result Value Range Status   Specimen Description BLOOD RIGHT HAND   Final   Special Requests NONE BOTTLES DRAWN AEROBIC AND ANAEROBIC 5CC EACH   Final   Culture  Setup Time     Final   Value: 01/23/2013 22:10     Performed at [facility]   Culture     Final   Value: NO GROWTH 5 DAYS     Performed at [facility]   Report Status 01/29/2013 FINAL   Final  URINE CULTURE     Status: None   Collection Time    01/23/13  9:54 PM      Result Value Range Status   Specimen Description URINE, CATHETERIZED   Final   Special Requests NONE   Final   Culture  Setup Time     Final   Value: 01/24/2013 08:48     Performed at [facility]   Colony Count     Final   Value: NO GROWTH     Performed at [facility]    Culture     Final   Value: NO GROWTH     Performed at [facility]   Report Status 01/25/2013 FINAL  Final  BODY FLUID CULTURE     Status: None   Collection Time    01/23/13  9:54 PM      Result Value Range Status   Specimen Description PLEURAL FLUID   Final   Special Requests NONE   Final   Gram Stain     Final   Value: ABUNDANT WBC PRESENT,BOTH PMN AND MONONUCLEAR     MODERATE GRAM POSITIVE COCCI     IN PAIRS FEW GRAM NEGATIVE RODS     Gram Stain Report Called to,Read Back By and Verified With: Gram Stain Report Called to,Read Back By and Verified With: Linton Flemings RN 01/24/13 1040AM BY MILSH     Performed at [facility]   Culture     Final   Value: MODERATE MICROAEROPHILIC STREPTOCOCCI     Note: Standardized susceptibility testing for this organism is not available.     Performed at [facility]   Report Status 01/27/2013 FINAL   Final  CULTURE, RESPIRATORY (NON-EXPECTORATED)     Status: None   Collection Time    01/24/13 12:20 AM      Result Value Range Status   Specimen Description TRACHEAL ASPIRATE   Final   Special Requests NONE   Final   Gram Stain     Final   Value: FEW WBC PRESENT,BOTH PMN AND MONONUCLEAR     NO SQUAMOUS EPITHELIAL CELLS SEEN     NO ORGANISMS SEEN     Performed at [facility]   Culture     Final   Value: FEW YEAST CONSISTENT WITH CANDIDA SPECIES     Performed at [facility]   Report Status 01/26/2013 FINAL   Final  MRSA PCR SCREENING     Status: None   Collection Time    01/24/13  7:14 AM      Result Value Range Status   MRSA by PCR NEGATIVE  NEGATIVE Final   Comment:            The GeneXpert MRSA Assay (FDA     approved for NASAL specimens     only), is one component of a     comprehensive MRSA colonization     surveillance program. It is not     intended to diagnose MRSA     infection nor to guide or     monitor treatment for     MRSA infections.     Studies:  Recent x-ray studies have  been reviewed in detail by the Attending Physician  Scheduled Meds:  Scheduled Meds: . amoxicillin-clavulanate  1 tablet Oral Q12H  . antiseptic oral rinse  15 mL Mouth Rinse BID  . heparin subcutaneous  5,000 Units Subcutaneous Q8H  . insulin aspart  0-15 Units Subcutaneous TID WC  . insulin glargine  30 Units Subcutaneous Daily   Continuous Infusions: . 0.45 % NaCl with KCl 20 mEq / L 100 mL/hr at 01/31/13 1040    Time spent on care of this patient: 35 min   [facility]  [facility] Office  [phone] Pager - Text Page per Loretha Stapler as per below:  On-Call/Text Page:      [email or web address]      password [email or web address]  If 7PM-7AM, please contact night-coverage [email or web address] Password [email or web address] 01/31/2013, 2:03 PM   LOS: 8 days

## 2013-02-01 ENCOUNTER — Inpatient Hospital Stay (HOSPITAL_COMMUNITY): Payer: 59

## 2013-02-01 DIAGNOSIS — I4891 Unspecified atrial fibrillation: Secondary | ICD-10-CM

## 2013-02-01 LAB — GLUCOSE, CAPILLARY
GLUCOSE-CAPILLARY: 154 mg/dL — AB (ref 70–99)
Glucose-Capillary: 136 mg/dL — ABNORMAL HIGH (ref 70–99)
Glucose-Capillary: 142 mg/dL — ABNORMAL HIGH (ref 70–99)
Glucose-Capillary: 137 mg/dL — ABNORMAL HIGH (ref 70–99)

## 2013-02-01 LAB — BASIC METABOLIC PANEL
BUN: 41 mg/dL — ABNORMAL HIGH (ref 6–23)
CALCIUM: 8.1 mg/dL — AB (ref 8.4–10.5)
CO2: 25 mEq/L (ref 19–32)
Chloride: 102 mEq/L (ref 96–112)
Creatinine, Ser: 4.18 mg/dL — ABNORMAL HIGH (ref 0.50–1.35)
GFR, EST AFRICAN AMERICAN: 18 mL/min — AB (ref >90)
GFR, EST NON AFRICAN AMERICAN: 15 mL/min — AB (ref >90)
Glucose, Bld: 177 mg/dL — ABNORMAL HIGH (ref 70–99)
Potassium: 3.7 mEq/L (ref 3.7–5.3)
Sodium: 140 mEq/L (ref 137–147)

## 2013-02-01 MED ORDER — INSULIN PEN STARTER KIT
1.0000 | Freq: Once | Status: AC
  Administered 2013-02-01: 1
  Filled 2013-02-01 (×2): qty 1

## 2013-02-01 MED ORDER — ACETAMINOPHEN 160 MG/5ML PO SOLN: 650.0000 mg | ORAL | Status: DC | PRN

## 2013-02-01 MED ORDER — POTASSIUM CHLORIDE 20 MEQ/15ML (10%) PO LIQD
40.0000 meq | Freq: Once | ORAL | Status: AC
  Administered 2013-02-01: 40 meq via ORAL
  Filled 2013-02-01: qty 30

## 2013-02-01 NOTE — Progress Notes (Signed)
[facility] Progress Note [place] TEAM 1 - Stepdown/ICU TEAM   Stephen Stanton GBT:[id number] DOB: 1960-06-08 DOA: 01/23/2013 PCP: No PCP Per Patient    Brief narrative: 53 y/o with newly diagnosed DM who presented to the ER for shortness for breath which was of sudden onset along with a cough productive of yellow sputum. He was in [place] about a week prior and was diagnosed with DM in the ER and discharged home with Glyburide. Per the patient, he did not get admitted to the hospital but after d/c continued to feel weak. He later developed the above mentioned respiratory symptoms.  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 for a total duration of 21 days - stop date 1/15 - Titrate O2 as able  Active Problems:    A-fib - a single episode of A-fib with RVR occuring in the ICU- appears to have received an Amiodarone load - ECHO reveals normal LV function with a mildly dilated right atrium - would hold off on chronic anticoagulation at this time as the episode was likely influenced by current respiratory issues    DM (diabetes mellitus) - recent diagnosis- A1c is 14.2 - cont current dose of Lantus and sliding scale - start insulin teaching, consult Diabetes coordinator     Hypokalemia - replacing -  Mg+ level normal    Acute renal failure - question ATN vs Prerenal-  -  Lasix given on and off since admission - noted to have had hypotension on 12/30 with BP dropping into 71G systolic  - also question if related to Vancomycin as level was noted to be 27.8 on 12/29 - now being hydrated and also urinating well - renal ultrasound suggests chronic medical renal disease    HTN (hypertension) - no prior diagnosis of  this - will follow trend for now    Code Status: Full code Family Communication: none Disposition Plan: home  Consultants: PCCM CT surgery  Procedures: 12/25- intubated- right Chest Tube placement  Antibiotics: Cefepime 12/25 x 1  Zosyn 12/25 >>>12/31  Vancomycin 12/25 >>> 12/30  Augmentin 12/31 >>   DVT prophylaxis: Heparin  Objective: Blood pressure 162/87, pulse 98, temperature 98.9 F (37.2 C), temperature source Oral, resp. rate 18, height _0  (1.854 m), weight 114.3 kg (251 lb 15.8 oz), SpO2 97.00%.  Intake/Output Summary (Last 24 hours) at 02/01/13 1423 Last data filed at 02/01/13 0900  Gross per 24 hour  Intake   2120 ml  Output   2000 ml  Net    120 ml     Exam: General: No acute respiratory distress Lungs: mild crackles in right lung field Cardiovascular: Regular rate and rhythm without murmur gallop or rub normal S1 and S2 Abdomen: Nontender, nondistended, soft, bowel sounds positive, no rebound, no ascites, no appreciable mass Extremities: No significant cyanosis, clubbing, or edema bilateral lower extremities  Data Reviewed: Basic Metabolic Panel:  Recent Labs Lab 01/27/13 0400  01/29/13 0500 01/29/13 1500 01/30/13 0500 01/31/13 0500 02/01/13 0930  NA 142  < > 148* 145 147 144 140  K 3.2*  < > 3.1* 3.3* 3.2* 3.3* 3.7  CL 98  < > 104 102 102 105 102  CO2 32  < > _1 GLUCOSE 219*  < >  90 139* 83 167* 177*  BUN 12  < > 44* 46* 45* 43* 41*  CREATININE 0.71  < > 3.46* 3.83* 4.11* 4.16* 4.18*  CALCIUM 8.5  < > 8.2* 8.2* 8.4 7.9* 8.1*  MG 1.8  --   --   --   --  2.0  --   < > = values in this interval not displayed. Liver Function Tests: No results found for this basename: AST, ALT, ALKPHOS, BILITOT, PROT, ALBUMIN,  in the last 168 hours No results found for this basename: LIPASE, AMYLASE,  in the last 168 hours No results found for this basename: AMMONIA,  in the last 168 hours CBC:  Recent Labs Lab 01/27/13 0400  01/28/13 0434 01/29/13 0500 01/30/13 0500 01/31/13 0500  WBC 13.5* 10.4 12.2* 13.5* 11.5*  NEUTROABS  --   --  9.9* 10.8*  --   HGB 10.8* 9.4* 9.5* 10.1* 8.9*  HCT 33.8* 29.9* 30.4* 31.3* 29.0*  MCV 84.1 86.2 84.0 83.7 85.0  PLT 170 209 197 267 270   Cardiac Enzymes: No results found for this basename: CKTOTAL, CKMB, CKMBINDEX, TROPONINI,  in the last 168 hours BNP (last 3 results)  Recent Labs  01/23/13 1410 01/23/13 1749  PROBNP 488.1* 1140.0*   CBG:  Recent Labs Lab 01/31/13 1155 01/31/13 1634 01/31/13 2141 02/01/13 0753 02/01/13 1203  GLUCAP 150* 144* 161* 136* 142*    Recent Results (from the past 240 hour(s))  CULTURE, BLOOD (ROUTINE X 2)     Status: None   Collection Time    01/23/13  3:00 PM      Result Value Range Status   Specimen Description BLOOD LEFT HAND   Final   Special Requests NONE BOTTLES DRAWN AEROBIC ONLY 5CC ONLY   Final   Culture  Setup Time     Final   Value: 01/23/2013 22:10     Performed at [place]     Final   Value: NO GROWTH 5 DAYS     Performed at [facility]   Report Status 01/29/2013 FINAL   Final  CULTURE, BLOOD (ROUTINE X 2)     Status: None   Collection Time    01/23/13  3:00 PM      Result Value Range Status   Specimen Description BLOOD RIGHT HAND   Final   Special Requests NONE BOTTLES DRAWN AEROBIC AND ANAEROBIC 5CC EACH   Final   Culture  Setup Time     Final   Value: 01/23/2013 22:10     Performed at [place]     Final   Value: NO GROWTH 5 DAYS     Performed at [facility]   Report Status 01/29/2013 FINAL   Final  URINE CULTURE     Status: None   Collection Time    01/23/13  9:54 PM      Result Value Range Status   Specimen Description URINE, CATHETERIZED   Final   Special Requests NONE   Final   Culture  Setup Time     Final   Value: 01/24/2013 08:48     Performed at [place]     Final   Value: NO GROWTH     Performed  at [facility]   Culture     Final   Value: NO GROWTH     Performed at [facility]   Report Status 01/25/2013  FINAL   Final  BODY FLUID CULTURE     Status: None   Collection Time    01/23/13  9:54 PM      Result Value Range Status   Specimen Description PLEURAL FLUID   Final   Special Requests NONE   Final   Gram Stain     Final   Value: ABUNDANT WBC PRESENT,BOTH PMN AND MONONUCLEAR     MODERATE GRAM POSITIVE COCCI     IN PAIRS FEW GRAM NEGATIVE RODS     Gram Stain Report Called to,Read Back By and Verified With: Gram Stain Report Called to,Read Back By and Verified With: Audie Pinto RN 01/24/13 1040AM BY [place]     Performed at [facility]   Culture     Final   Value: MODERATE MICROAEROPHILIC STREPTOCOCCI     Note: Standardized susceptibility testing for this organism is not available.     Performed at [facility]   Report Status 01/27/2013 FINAL   Final  CULTURE, RESPIRATORY (NON-EXPECTORATED)     Status: None   Collection Time    01/24/13 12:20 AM      Result Value Range Status   Specimen Description TRACHEAL ASPIRATE   Final   Special Requests NONE   Final   Gram Stain     Final   Value: FEW WBC PRESENT,BOTH PMN AND MONONUCLEAR     NO SQUAMOUS EPITHELIAL CELLS SEEN     NO ORGANISMS SEEN     Performed at [facility]   Culture     Final   Value: FEW YEAST CONSISTENT WITH CANDIDA SPECIES     Performed at [facility]   Report Status 01/26/2013 FINAL   Final  MRSA PCR SCREENING     Status: None   Collection Time    01/24/13  7:14 AM      Result Value Range Status   MRSA by PCR NEGATIVE  NEGATIVE Final   Comment:            The GeneXpert MRSA Assay (FDA     approved for NASAL specimens     only), is one component of a     comprehensive MRSA colonization     surveillance program. It is not     intended to diagnose MRSA     infection nor to guide or     monitor treatment for     MRSA infections.      Studies:  Recent x-ray studies have been reviewed in detail by the Attending Physician  Scheduled Meds:  Scheduled Meds: . amoxicillin-clavulanate  1 tablet Oral Q12H  . antiseptic oral rinse  15 mL Mouth Rinse BID  . bd getting started take home kit  1 kit Other Once  . heparin subcutaneous  5,000 Units Subcutaneous Q8H  . insulin aspart  0-15 Units Subcutaneous TID WC  . insulin glargine  30 Units Subcutaneous Daily  . living well with diabetes book   Does not apply Once   Continuous Infusions:    Time spent on care of this patient: 35 min   [place]  [phone] Pager - Text Page per Shea Evans as per below:  On-Call/Text Page:      [email or web address]      password [email or web address]  If 7PM-7AM, please contact night-coverage [email or web address] Password [email or web address] 02/01/2013, 2:23 PM   LOS: 9 days

## 2013-02-01 NOTE — Progress Notes (Signed)
Assessment:  1 AKI, hemodynamically mediated due to hypotension  2 Hypokalemia Plan:  1 Supportive therapy for now. 2 K replace  Subjective: Interval History: good uop  Objective: Vital signs in last 24 hours: Temp:  [98.6 F (37 C)-99.1 F (37.3 C)] 98.9 F (37.2 C) (01/03 0751) Pulse Rate:  [69-85] 79 (01/03 0900) Resp:  [20-30] 21 (01/03 0900) BP: (140-160)/(85-97) 144/85 mmHg (01/03 0751) SpO2:  [90 %-100 %] 98 % (01/03 0900) Weight:  [114.3 kg (251 lb 15.8 oz)] 114.3 kg (251 lb 15.8 oz) (01/03 0500) Weight change: 2.4 kg (5 lb 4.7 oz)  Intake/Output from previous day: 01/02 0701 - 01/03 0700 In: 1920 [P.O.:720; I.V.:1200] Out: 2100 [Urine:2100] Intake/Output this shift: Total I/O In: 200 [I.V.:200] Out: -   General appearance: alert and cooperative Resp: clear to auscultation bilaterally Extremities: edema 2+  Lab Results:  Recent Labs  01/30/13 0500 01/31/13 0500  WBC 13.5* 11.5*  HGB 10.1* 8.9*  HCT 31.3* 29.0*  PLT 267 270   BMET:  Recent Labs  01/30/13 0500 01/31/13 0500  NA 147 144  K 3.2* 3.3*  CL 102 105  CO2 27 27  GLUCOSE 83 167*  BUN 45* 43*  CREATININE 4.11* 4.16*  CALCIUM 8.4 7.9*   No results found for this basename: PTH,  in the last 72 hours [place] Studies: No results found for this basename: IRON, TIBC, TRANSFERRIN, FERRITIN,  in the last 72 hours Studies/Results: US Renal  01/31/2013   CLINICAL DATA:  Renal failure.  EXAM: RENAL/URINARY TRACT ULTRASOUND COMPLETE  COMPARISON:  None.  FINDINGS: Right Kidney:  Length: 13.7 cm. Right kidney is diffusely hyperechoic consistent with chronic medical renal disease. No hydronephrosis. No mass.  Left Kidney:  Length: 14.5 cm. Left kidney is diffusely hyperechoic consistent with chronic medical renal disease. No hydronephrosis. No mass.  Bladder:  Appears normal for degree of bladder distention.  IMPRESSION: Both kidneys are hyperechoic suggesting chronic medical renal disease. No  hydronephrosis. No bladder distention.   Electronically Signed   By: Marcello Moores  Register   On: 01/31/2013 10:28   Dg Chest Port 1 View  02/01/2013   CLINICAL DATA:  Recently removed chest tube.  EXAM: PORTABLE CHEST - 1 VIEW  COMPARISON:  01/31/2013  FINDINGS: Elevation of the right hemidiaphragm with volume loss in the right lung. Central line tip is in the SVC region. Diffuse interstitial densities are suggestive for pulmonary edema and unchanged. No evidence for a pneumothorax. Heart size is stable.  IMPRESSION: Diffuse interstitial densities are suggestive for interstitial edema.  Low lung volumes, particularly in the right chest.   Electronically Signed   By: Markus Daft M.D.   On: 02/01/2013 07:55   Dg Chest Port 1 View  01/31/2013   CLINICAL DATA:  Status post chest tube removal  EXAM: PORTABLE CHEST - 1 VIEW  COMPARISON:  01/30/2013  FINDINGS: Right chest tube has been removed. No pneumothorax. Right central line stable.  Consolidation right base, unchanged. Bilateral multifocal infiltrates unchanged.  IMPRESSION: No pneumothorax status post right chest tube removal.   Electronically Signed   By: Skipper Cliche M.D.   On: 01/31/2013 17:46   Scheduled: . amoxicillin-clavulanate  1 tablet Oral Q12H  . antiseptic oral rinse  15 mL Mouth Rinse BID  . bd getting started take home kit  1 kit Other Once  . heparin subcutaneous  5,000 Units Subcutaneous Q8H  . insulin aspart  0-15 Units Subcutaneous TID WC  . insulin glargine  30  Units Subcutaneous Daily  . living well with diabetes book   Does not apply Once  . potassium chloride  40 mEq Oral Once     LOS: 9 days   Jameria Bradway C 02/01/2013,9:12 AM

## 2013-02-01 NOTE — Progress Notes (Signed)
PULMONARY / CRITICAL CARE MEDICINE  Name: Stephen Stanton MRN: [id number] DOB: 04-25-1960    ADMISSION DATE:  01/23/2013 CONSULTATION DATE:  01/23/2013  REFERRING MD :  HP EDP PRIMARY SERVICE:  PCCM  CHIEF COMPLAINT:  Shortness of breath  BRIEF PATIENT DESCRIPTION: 53 y/o with PMH of new onset diabetes (brief admission one week ago) [facility] with with dyspnea and fever.  In ED required BiPAP. Chest imaging demonstrated bilateral pneumonia and likely empyema on the R.  Transferred [facility] for further management.  SIGNIFICANT EVENTS / STUDIES:  12/25  [facility] with respiratory distress and fever 12/25  CTA chest: No central PE, extensive bilateral pneumonia R>L, large empyema with air fluid level on the right 12/25  Case discussed with TCTS >>> Chest tube by PCCM, TCTS will evaluate in am 12/25  [facility]  Intubated, CVL placed, R tube thoracotomy  12/26  TTE >>> EF 50-55, no RWMA 12/26  Venous Doppler >>> no DVT 12/28  Chest CT >>> bilateral airspace disease, near complete resolution of bilateral pneumonia 01/03  Up to bedside, no distress.  CT out, CXR stable  LINES / TUBES: OETT 12/25 >>> 12/27 OGT 12/25 >>> 12/27 R IJ TLC 12/25 >>> Foley 12/25 >>> 12/27 R chest tube 12/25 >>> OETT 12/28>> 12/30  CULTURES: 12/25 MRSA >>> neg 12/25 Blood >>> 12/25 Urine >>> neg 12/25 Respiratory >>>yeast 12/25 Pleural fluid >>> GPC in pairs / GNB >>> microaerophilic strep  ANTIBIOTICS: Cefepime 12/25 x 1 Zosyn 12/25 >>>12/31 Vancomycin 12/25 >>> 12/30 Augmentin 12/31 >>  INTERVAL HISTORY:  He feels well, no distress. Hypokalemia.     VITAL SIGNS: Temp:  [98.6 F (37 C)-99.1 F (37.3 C)] 98.9 F (37.2 C) (01/03 0751) Pulse Rate:  [69-85] 79 (01/03 0900) Resp:  [20-30] 21 (01/03 0900) BP: (140-160)/(85-97) 144/85 mmHg (01/03 0751) SpO2:  [90 %-100 %] 98 % (01/03 0900) Weight:  [251 lb 15.8 oz (114.3 kg)] 251 lb 15.8 oz (114.3 kg) (01/03 0500) Room air    INTAKE / OUTPUT: Intake/Output     01/02 0701 - 01/03 0700 01/03 0701 - 01/04 0700   P.O. 720    I.V. (mL/kg) 1200 (10.5) 200 (1.7)   Total Intake(mL/kg) 1920 (16.8) 200 (1.7)   Urine (mL/kg/hr) 2100 (0.8)    Chest Tube     Total Output 2100     Net -180 +200        Urine Occurrence 1 x    Stool Occurrence 2 x      PHYSICAL EXAMINATION: General:  Awake, conversant HEENT: NCAT, EOMi PULM: few crackles R lower posterior, L clear CV: RRR, no mgr AB: BS+, soft, nontender Ext: warm, improved ankle edema Neuro: Awake, alert on vent  LABS: CBC  Recent Labs Lab 01/29/13 0500 01/30/13 0500 01/31/13 0500  WBC 12.2* 13.5* 11.5*  HGB 9.5* 10.1* 8.9*  HCT 30.4* 31.3* 29.0*  PLT 197 267 270   BMET  Recent Labs Lab 01/29/13 1500 01/30/13 0500 01/31/13 0500  NA 145 147 144  K 3.3* 3.2* 3.3*  CL 102 102 105  CO2 28 27 27   BUN 46* 45* 43*  CREATININE 3.83* 4.11* 4.16*  GLUCOSE 139* 83 167*   Glucose  Recent Labs Lab 01/30/13 2134 01/31/13 0815 01/31/13 1155 01/31/13 1634 01/31/13 2141 02/01/13 0753  GLUCAP 139* 123* 150* 144* 161* 136*   CXR: 1/3 bibasilar airspace disease. No PTX   ASSESSMENT / PLAN:  PULMONARY A:  HCAP (recent hospitalization).  Empyema > s/p chest tube and doing well (< 20 ml output in >24hrs) Acute hypoxemic respiratory failure 12/28 presumably related to pulm edema> improving, now on room air, residual airspace disease will take weeks to clear P: Incentive spirometry  Repeat CXR PRN See ID section Mobilize  Can follow up with PCP or Pulmonary office for repeat CXR 2 wks post discharge  CARDIOVASCULAR A:  Sepsis - resolved.  Diastolic dysfunction? >Normal LVF, but improved oxygenation with diuresis DVT ruled out. Afib with RVR > resolved P:  Hold lasix Tele  RENAL A:   Non-oliguric AKI > due to over diuresis? FENa 10%, possibly related to lasix? Scr a little worse Hypokalemia. P:   Replete K Repeat  BMET  GASTROINTESTINAL A:  No active issues. P:   Diet as tolerated  HEMATOLOGIC A:  Mild anemia. P:  Trend CBC Heparin for DVT Px  INFECTIOUS A:  HCAP. Empyema. P:   Augmentin, given empyema and severity of illness would complete a 21 day course  ENDOCRINE  A:  DM. Hyperglycemia. P:   SSI ac&hs Lantus 30 Diabetic Teaching  NEUROLOGIC A:  Mild deconditioning P:   Ambulate today PT if needed   Ok to move out of SDU from pulmonary standpoint.  Will defer to [facility].    Canary Brim, NP-C [facility] Pgr: [phone] or [phone]  I have personally obtained history, examined patient, evaluated and interpreted laboratory and imaging results, reviewed medical records, formulated assessment / plan and placed orders.  02/01/2013, 9:31 AM  Attending:  I have seen and examined the patient with nurse practitioner/resident and agree with the note above.   Chest tube out Needs CXR in 2-3 weeks as outpatient, suspect scar,pleural thickening for life on CXR PCCM to sign off  Yolonda Kida PCCM Pager: [phone] Cell: [phone] If no response, call [phone]

## 2013-02-01 NOTE — Progress Notes (Signed)
Inpatient Diabetes Program Recommendations  AACE/ADA: New Consensus Statement on Inpatient Glycemic Control (2013)  Target Ranges:  Prepandial:   less than 140 mg/dL      Peak postprandial:   less than 180 mg/dL (1-2 hours)      Critically ill patients:  140 - 180 mg/dL   Reason for Visit: New to Insulin  Pt states he wants to use pen rather than syringe for his insulin. Discussed and demonstrated technique, rotating sites, 2 unit test shot, and counting to 10 for insulin injection. Also discussed hypoglycemia s/s and treatment and pt was able to verbalize same. Very motivated and states he will "do what it takes to control his diabetes." Eats healthy diet at home and exercises regularly.  We discussed pathophysiology of diabetes, how diet, exercise and stress affects blood sugars, and monitoring. States he is in the process of obtaining a PCP to manage his diabetes. To watch diabetes videos this afternoon. Agree with Lantus 30 units QAM and Novolog moderate S/S.  Will need to fine-tune insulin therapy with PCP, since pt will likely need meal coverage insuin.    Note: Discussed with Millie, RN. Thank you. Ailene Ardshonda Tiona Ruane, RD, LDN, CDE Inpatient Diabetes Coordinator [phone]

## 2013-02-02 DIAGNOSIS — I10 Essential (primary) hypertension: Secondary | ICD-10-CM

## 2013-02-02 LAB — BASIC METABOLIC PANEL
BUN: 51 mg/dL — ABNORMAL HIGH (ref 6–23)
CHLORIDE: 105 meq/L (ref 96–112)
CO2: 23 mEq/L (ref 19–32)
Calcium: 8.2 mg/dL — ABNORMAL LOW (ref 8.4–10.5)
Creatinine, Ser: 4.08 mg/dL — ABNORMAL HIGH (ref 0.50–1.35)
GFR calc Af Amer: 18 mL/min — ABNORMAL LOW (ref >90)
GFR calc non Af Amer: 16 mL/min — ABNORMAL LOW (ref >90)
Glucose, Bld: 166 mg/dL — ABNORMAL HIGH (ref 70–99)
POTASSIUM: 3.8 meq/L (ref 3.7–5.3)
SODIUM: 142 meq/L (ref 137–147)

## 2013-02-02 LAB — GLUCOSE, CAPILLARY
GLUCOSE-CAPILLARY: 181 mg/dL — AB (ref 70–99)
Glucose-Capillary: 143 mg/dL — ABNORMAL HIGH (ref 70–99)
Glucose-Capillary: 148 mg/dL — ABNORMAL HIGH (ref 70–99)
Glucose-Capillary: 92 mg/dL (ref 70–99)

## 2013-02-02 MED ORDER — AMLODIPINE BESYLATE 5 MG PO TABS
5.0000 mg | ORAL_TABLET | Freq: Every day | ORAL | Status: DC
  Administered 2013-02-02 – 2013-02-06 (×5): 5 mg via ORAL
  Filled 2013-02-02 (×5): qty 1

## 2013-02-02 MED ORDER — FUROSEMIDE 10 MG/ML IJ SOLN
60.0000 mg | Freq: Four times a day (QID) | INTRAMUSCULAR | Status: AC
  Administered 2013-02-02 (×2): 60 mg via INTRAVENOUS
  Filled 2013-02-02 (×4): qty 6

## 2013-02-02 MED ORDER — SODIUM CHLORIDE 0.9 % IJ SOLN
10.0000 mL | INTRAMUSCULAR | Status: DC | PRN
  Administered 2013-02-02: 20 mL
  Administered 2013-02-03: 10 mL
  Administered 2013-02-03: 30 mL
  Administered 2013-02-03 (×2): 10 mL
  Administered 2013-02-04 (×2): 20 mL
  Administered 2013-02-05 – 2013-02-06 (×6): 10 mL

## 2013-02-02 NOTE — Progress Notes (Signed)
MD, please provide an order to have right IJ removed as per IV Team. Patient is not receiving any medications or fluids through that route.  Right IJ was placed on 01/23/13.

## 2013-02-02 NOTE — Progress Notes (Signed)
RN explained to pt about giving himself SQ injections.  We discussed rotating sites, cleaning the area, and injecting.  Pt verbalized understanding of instructions.  Pt then demonstrated giving himself the insulin injection (into his abdomen).  Pt is very nervous about needles and it took him a little while to compose himself, but he did give the injection without any difficulties.  Will continue to reinforce.  Pt has already been watching several of the videos.

## 2013-02-02 NOTE — Progress Notes (Signed)
Patient watched DM videos last night after the game.

## 2013-02-02 NOTE — Progress Notes (Signed)
[facility] Progress Note [place] TEAM 1 - Stepdown/ICU TEAM   Truxton Stupka HKV:[id number] DOB: [birth year] DOA: 01/23/2013 PCP: No PCP Per Patient    Brief narrative: 53 y/o with newly diagnosed DM who presented to the ER for shortness for breath which was of sudden onset along with a cough productive of yellow sputum. He was in [place] about a week prior and was diagnosed with DM in the ER and discharged home with Glyburide. Per the patient, he did not get admitted to the hospital but after d/c continued to feel weak. He later developed the above mentioned respiratory symptoms.  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 fluid for a total duration of 21 days - stop date 1/15  Active Problems:    A-fib - a single episode of A-fib with RVR occuring in the ICU- appears to have received an Amiodarone load - ECHO reveals normal LV function with a mildly dilated right atrium - would hold off on chronic anticoagulation at this time as the episode was likely influenced by current respiratory issues    DM (diabetes mellitus) - recent diagnosis- A1c is 14.2 - cont current dose of Lantus and sliding scale - started insulin teaching, consult Diabetes coordinator     Hypokalemia - replacing -  Mg+ level normal    Acute renal failure - question ATN vs Prerenal-  -  Lasix given on and off since admission - noted to have had hypotension on 12/30 with BP dropping into 56E systolic  - also question if related to Vancomycin as level was noted to be 27.8 on 12/29 - has been urinating well - renal ultrasound suggests chronic medical  renal disease - appreciate renal eval- Cr now improving- as long as it continues to trend downward, he may be discharged soon    HTN (hypertension) - no prior diagnosis of this - will initiate low dose Norvasc today - ideally would like to start and ACE i but this will need to be done as outpt once Cr improves to baseline    Code Status: Full code Family Communication: none Disposition Plan: home  Consultants: PCCM CT surgery  Procedures: 12/25- intubated- right Chest Tube placement  Antibiotics: Cefepime 12/25 x 1  Zosyn 12/25 >>>12/31  Vancomycin 12/25 >>> 12/30  Augmentin 12/31 >>   DVT prophylaxis: Heparin  Objective: Blood pressure 157/103, pulse 84, temperature 99.1 F (37.3 C), temperature source Oral, resp. rate 18, height 6' 1"  (1.854 m), weight 114.805 kg (253 lb 1.6 oz), SpO2 97.00%.  Intake/Output Summary (Last 24 hours) at 02/02/13 1618 Last data filed at 02/02/13 1300  Gross per 24 hour  Intake    960 ml  Output      0 ml  Net    960 ml     Exam: General: No acute respiratory distress Lungs: mild crackles in right lung field Cardiovascular: Regular rate and rhythm without murmur gallop or rub normal S1 and S2 Abdomen: Nontender, nondistended, soft, bowel sounds positive, no rebound, no ascites, no appreciable mass Extremities: No significant cyanosis, clubbing, or edema bilateral lower extremities  Data Reviewed: Basic Metabolic Panel:  Recent Labs Lab 01/27/13 0400  01/29/13  1500 01/30/13 0500 01/31/13 0500 02/01/13 0930 02/02/13 0600  NA 142  < > 145 147 144 140 142  K 3.2*  < > 3.3* 3.2* 3.3* 3.7 3.8  CL 98  < > 102 102 105 102 105  CO2 32  < > 28 27 27 25 23   GLUCOSE 219*  < > 139* 83 167* 177* 166*  BUN 12  < > 46* 45* 43* 41* 51*  CREATININE 0.71  < > 3.83* 4.11* 4.16* 4.18* 4.08*  CALCIUM 8.5  < > 8.2* 8.4 7.9* 8.1* 8.2*  MG 1.8  --   --   --  2.0  --   --   < > = values in this interval not displayed. Liver Function  Tests: No results found for this basename: AST, ALT, ALKPHOS, BILITOT, PROT, ALBUMIN,  in the last 168 hours No results found for this basename: LIPASE, AMYLASE,  in the last 168 hours No results found for this basename: AMMONIA,  in the last 168 hours CBC:  Recent Labs Lab 01/27/13 0400 01/28/13 0434 01/29/13 0500 01/30/13 0500 01/31/13 0500  WBC 13.5* 10.4 12.2* 13.5* 11.5*  NEUTROABS  --   --  9.9* 10.8*  --   HGB 10.8* 9.4* 9.5* 10.1* 8.9*  HCT 33.8* 29.9* 30.4* 31.3* 29.0*  MCV 84.1 86.2 84.0 83.7 85.0  PLT 170 209 197 267 270   Cardiac Enzymes: No results found for this basename: CKTOTAL, CKMB, CKMBINDEX, TROPONINI,  in the last 168 hours BNP (last 3 results)  Recent Labs  01/23/13 1410 01/23/13 1749  PROBNP 488.1* 1140.0*   CBG:  Recent Labs Lab 02/01/13 1203 02/01/13 1717 02/01/13 2103 02/02/13 0731 02/02/13 1141  GLUCAP 142* 137* 154* 143* 148*    Recent Results (from the past 240 hour(s))  URINE CULTURE     Status: None   Collection Time    01/23/13  9:54 PM      Result Value Range Status   Specimen Description URINE, CATHETERIZED   Final   Special Requests NONE   Final   Culture  Setup Time     Final   Value: 01/24/2013 08:48     Performed at [facility]     Final   Value: NO GROWTH     Performed at [facility]   Culture     Final   Value: NO GROWTH     Performed at [facility]   Report Status 01/25/2013 FINAL   Final  BODY FLUID CULTURE     Status: None   Collection Time    01/23/13  9:54 PM      Result Value Range Status   Specimen Description PLEURAL FLUID   Final   Special Requests NONE   Final   Gram Stain     Final   Value: ABUNDANT WBC PRESENT,BOTH PMN AND MONONUCLEAR     MODERATE GRAM POSITIVE COCCI     IN PAIRS FEW GRAM NEGATIVE RODS     Gram Stain Report Called to,Read Back By and Verified With: Gram Stain Report Called to,Read Back By and Verified With: Audie Pinto RN 01/24/13  1040AM BY [place]     Performed at [facility]   Culture     Final   Value: MODERATE MICROAEROPHILIC STREPTOCOCCI     Note: Standardized susceptibility testing for this organism is not available.     Performed at [facility]   Report Status 01/27/2013 FINAL  Final  CULTURE, RESPIRATORY (NON-EXPECTORATED)     Status: None   Collection Time    01/24/13 12:20 AM      Result Value Range Status   Specimen Description TRACHEAL ASPIRATE   Final   Special Requests NONE   Final   Gram Stain     Final   Value: FEW WBC PRESENT,BOTH PMN AND MONONUCLEAR     NO SQUAMOUS EPITHELIAL CELLS SEEN     NO ORGANISMS SEEN     Performed at [facility]   Culture     Final   Value: FEW YEAST CONSISTENT WITH CANDIDA SPECIES     Performed at [facility]   Report Status 01/26/2013 FINAL   Final  MRSA PCR SCREENING     Status: None   Collection Time    01/24/13  7:14 AM      Result Value Range Status   MRSA by PCR NEGATIVE  NEGATIVE Final   Comment:            The GeneXpert MRSA Assay (FDA     approved for NASAL specimens     only), is one component of a     comprehensive MRSA colonization     surveillance program. It is not     intended to diagnose MRSA     infection nor to guide or     monitor treatment for     MRSA infections.     Studies:  Recent x-ray studies have been reviewed in detail by the Attending Physician  Scheduled Meds:  Scheduled Meds: . amLODipine  5 mg Oral Daily  . amoxicillin-clavulanate  1 tablet Oral Q12H  . antiseptic oral rinse  15 mL Mouth Rinse BID  . bd getting started take home kit  1 kit Other Once  . heparin subcutaneous  5,000 Units Subcutaneous Q8H  . insulin aspart  0-15 Units Subcutaneous TID WC  . insulin glargine  30 Units Subcutaneous Daily  . living well with diabetes book   Does not apply Once   Continuous Infusions:    Time spent on care of this patient: 35 min   [place]   [phone] Pager - Text Page per Shea Evans as per below:  On-Call/Text Page:      [email or web address]      password [email or web address]  If 7PM-7AM, please contact night-coverage [email or web address] Password [email or web address] 02/02/2013, 4:18 PM   LOS: 10 days

## 2013-02-02 NOTE — Progress Notes (Addendum)
Assessment/Plan:  1. AKI, hemodynamically mediated due to hypotension. Starting to recover. Creatinine peaked at 4.16 2 days ago, down slightly today at 4.08.  Not collecting all his urine output, so I/O's not accurate. BP and volume are up.  Will give IV lasix x 2.  Collect and record all UOP.   2. Hypokalemia- improving 3. PNA/empyema- s/p IV abx, on po Augmentin to finish 21d course due to empyema. Chest tubes out x 48h.   Subjective: Interval History: good uop subjectively  Objective: Vital signs in last 24 hours: Temp:  [98.7 F (37.1 C)-99.1 F (37.3 C)] 99.1 F (37.3 C) (01/04 1346) Pulse Rate:  [74-84] 84 (01/04 1346) Resp:  [18] 18 (01/04 1346) BP: (144-170)/(85-110) 157/103 mmHg (01/04 1346) SpO2:  [95 %-100 %] 97 % (01/04 1346) Weight change: 0.505 kg (1 lb 1.8 oz)  Intake/Output from previous day: 01/03 0701 - 01/04 0700 In: 1160 [P.O.:960; I.V.:200] Out: 500 [Urine:500] Intake/Output this shift: Total I/O In: 480 [P.O.:480] Out: -   General appearance: alert and cooperative Resp: clear on left, R base absent BS Extremities: edema 2+  Lab Results:  Recent Labs  01/31/13 0500  WBC 11.5*  HGB 8.9*  HCT 29.0*  PLT 270   BMET:   Recent Labs  02/01/13 0930 02/02/13 0600  NA 140 142  K 3.7 3.8  CL 102 105  CO2 25 23  GLUCOSE 177* 166*  BUN 41* 51*  CREATININE 4.18* 4.08*  CALCIUM 8.1* 8.2*   No results found for this basename: PTH,  in the last 72 hours Iron Studies: No results found for this basename: IRON, TIBC, TRANSFERRIN, FERRITIN,  in the last 72 hours Studies/Results: Dg Chest Port 1 View  02/01/2013   CLINICAL DATA:  Recently removed chest tube.  EXAM: PORTABLE CHEST - 1 VIEW  COMPARISON:  01/31/2013  FINDINGS: Elevation of the right hemidiaphragm with volume loss in the right lung. Central line tip is in the SVC region. Diffuse interstitial densities are suggestive for pulmonary edema and unchanged. No evidence for a pneumothorax. Heart size  is stable.  IMPRESSION: Diffuse interstitial densities are suggestive for interstitial edema.  Low lung volumes, particularly in the right chest.   Electronically Signed   By: Markus Daft M.D.   On: 02/01/2013 07:55   Dg Chest Port 1 View  01/31/2013   CLINICAL DATA:  Status post chest tube removal  EXAM: PORTABLE CHEST - 1 VIEW  COMPARISON:  01/30/2013  FINDINGS: Right chest tube has been removed. No pneumothorax. Right central line stable.  Consolidation right base, unchanged. Bilateral multifocal infiltrates unchanged.  IMPRESSION: No pneumothorax status post right chest tube removal.   Electronically Signed   By: Skipper Cliche M.D.   On: 01/31/2013 17:46   Scheduled: . amLODipine  5 mg Oral Daily  . amoxicillin-clavulanate  1 tablet Oral Q12H  . antiseptic oral rinse  15 mL Mouth Rinse BID  . bd getting started take home kit  1 kit Other Once  . heparin subcutaneous  5,000 Units Subcutaneous Q8H  . insulin aspart  0-15 Units Subcutaneous TID WC  . insulin glargine  30 Units Subcutaneous Daily  . living well with diabetes book   Does not apply Once     LOS: 10 days   [place] D 02/02/2013,4:15 PM

## 2013-02-02 NOTE — Progress Notes (Signed)
RN did some teaching with patient about drawing up insulin from a vial into the syringe.  Pt asked a few questions, but overall seemed to understand.  Demonstrated drawing up and giving himself the injection.  Will cont to monitor,

## 2013-02-03 LAB — GLUCOSE, CAPILLARY
GLUCOSE-CAPILLARY: 92 mg/dL (ref 70–99)
Glucose-Capillary: 159 mg/dL — ABNORMAL HIGH (ref 70–99)
Glucose-Capillary: 209 mg/dL — ABNORMAL HIGH (ref 70–99)
Glucose-Capillary: 101 mg/dL — ABNORMAL HIGH (ref 70–99)

## 2013-02-03 MED ORDER — HYDRALAZINE HCL 20 MG/ML IJ SOLN
5.0000 mg | Freq: Four times a day (QID) | INTRAMUSCULAR | Status: DC | PRN
  Administered 2013-02-03: 5 mg via INTRAVENOUS
  Filled 2013-02-03: qty 1

## 2013-02-03 NOTE — Progress Notes (Signed)
NUTRITION FOLLOW UP  Intervention:   Provided Type 2 diabetes diet education; gave pt "Type 2 Diabetes Nutrition Therapy" handout from the Academy of [employer]etics Encouraged healthful diet  Nutrition Dx:   Inadequate oral intake related to inability to eat as evidenced by NPO status; discontinued   New Nutrition Dx: Nutrition related knowledge deficit related to new onset diabetes as evidenced by hemoglobin A1C of 14.2  Goal:   Enteral nutrition to provide 60-70% of estimated calorie needs (22-25 kcals/kg ideal body weight) and 100% of estimated protein needs, based on ASPEN guidelines for permissive underfeeding in critically ill obese individuals; discontinued  New Goals: 1-Pt able to identify source of carbohydrates in diet; met 2-Pt to meet >/= 90% of their estimated nutrition needs; met  Monitor:   Weight trends, labs, vent status  Assessment:   Pt was extubated 12/30 and diet advanced to carb modified that day. Pt states that his appetite is great and he is eating 75-100% of all meals. Pt reports that he usually weight 255 lbs and he typically eats 4-5 meals daily. Pt with questions regarding diabetic diet.   Lab Results  Component Value Date   HGBA1C 14.2* 01/30/2013    RD provided "Type 2 Diabetes Nutrition Therapy" handout from the Academy of Nutrition and Dietetics. Pt's wife present at bedside. Discussed different food groups and their effects on blood sugar, emphasizing carbohydrate-containing foods. Provided list of carbohydrates and recommended serving sizes of common foods.  Discussed importance of controlled and consistent carbohydrate intake throughout the day. Provided examples of ways to balance meals/snacks and encouraged intake of high-fiber, whole grain complex carbohydrates. Teach back method used. Pt reports eating 6 very large apples daily PTA which he thinks may have contributed to high hemoglobin A1C. Pt also states that he needs to decrease his  intake of potatoes and corn.  Expect very good compliance.   Height: Ht Readings from Last 1 Encounters:  02/01/13 6' 1"  (1.854 m)    Weight Status:   Wt Readings from Last 1 Encounters:  02/02/13 250 lb 14.1 oz (113.8 kg)    Re-estimated needs:  Kcal: 2000-2200 Protein: 100-100 grams Fluid: 2.8 L/day  Skin: +1 RLE and LLE edema; chest incision  Diet Order: Carb Control   Intake/Output Summary (Last 24 hours) at 02/03/13 1520 Last data filed at 02/03/13 1518  Gross per 24 hour  Intake   2680 ml  Output   9125 ml  Net  -6445 ml    Last BM: 02/02/13  Labs:   Recent Labs Lab 01/31/13 0500 02/01/13 0930 02/02/13 0600  NA 144 140 142  K 3.3* 3.7 3.8  CL 105 102 105  CO2 27 25 23   BUN 43* 41* 51*  CREATININE 4.16* 4.18* 4.08*  CALCIUM 7.9* 8.1* 8.2*  MG 2.0  --   --   GLUCOSE 167* 177* 166*    CBG (last 3)   Recent Labs  02/02/13 2311 02/03/13 0751 02/03/13 1142  GLUCAP 181* 159* 209*    Scheduled Meds: . amLODipine  5 mg Oral Daily  . amoxicillin-clavulanate  1 tablet Oral Q12H  . bd getting started take home kit  1 kit Other Once  . heparin subcutaneous  5,000 Units Subcutaneous Q8H  . insulin aspart  0-15 Units Subcutaneous TID WC  . insulin glargine  30 Units Subcutaneous Daily  . living well with diabetes book   Does not apply Once    Continuous Infusions:   Pryor Ochoa RD,  LDN Inpatient Clinical Dietitian Pager: [phone] After Hours Pager: [phone]

## 2013-02-03 NOTE — Progress Notes (Signed)
S: Feels well.  Urinated a lot last night O:BP 143/88  Pulse 78  Temp(Src) 98.4 F (36.9 C) (Oral)  Resp 20  Ht 6' 1"  (1.854 m)  Wt 113.8 kg (250 lb 14.1 oz)  BMI 33.11 kg/m2  SpO2 99%  Intake/Output Summary (Last 24 hours) at 02/03/13 1330 Last data filed at 02/03/13 1130  Gross per 24 hour  Intake   2680 ml  Output   8425 ml  Net  -5745 ml   Weight change: -1.005 kg (-2 lb 3.5 oz) BRA:XENMM and alert CVS: Reg with occasional extra beat Resp:few basilar crackles Abd:+ BS NTND Ext:1+ edema NEURO:CNI Ox3 no asterixis   . amLODipine  5 mg Oral Daily  . amoxicillin-clavulanate  1 tablet Oral Q12H  . bd getting started take home kit  1 kit Other Once  . heparin subcutaneous  5,000 Units Subcutaneous Q8H  . insulin aspart  0-15 Units Subcutaneous TID WC  . insulin glargine  30 Units Subcutaneous Daily  . living well with diabetes book   Does not apply Once   No results found. BMET    Component Value Date/Time   NA 142 02/02/2013 0600   K 3.8 02/02/2013 0600   CL 105 02/02/2013 0600   CO2 23 02/02/2013 0600   GLUCOSE 166* 02/02/2013 0600   BUN 51* 02/02/2013 0600   CREATININE 4.08* 02/02/2013 0600   CALCIUM 8.2* 02/02/2013 0600   GFRNONAA 16* 02/02/2013 0600   GFRAA 18* 02/02/2013 0600   CBC    Component Value Date/Time   WBC 11.5* 01/31/2013 0500   RBC 3.41* 01/31/2013 0500   HGB 8.9* 01/31/2013 0500   HCT 29.0* 01/31/2013 0500   PLT 270 01/31/2013 0500   MCV 85.0 01/31/2013 0500   MCH 26.1 01/31/2013 0500   MCHC 30.7 01/31/2013 0500   RDW 13.9 01/31/2013 0500   LYMPHSABS 1.9 01/30/2013 0500   MONOABS 0.7 01/30/2013 0500   EOSABS 0.1 01/30/2013 0500   BASOSABS 0.0 01/30/2013 0500     Assessment: 1. ARF presumably sec ATN. Scr fractionally down but UO was excellent 2. Empyema 3. A. Fib 4. DM 5. HTN  Plan: 1.  Hold off on lasix today.  Will use PRN 2. Daily Scr 3.  When when Scr trends down further.  He needs to get a PCP   Stephen Stanton

## 2013-02-03 NOTE — Progress Notes (Signed)
[facility] PROGRESS NOTE  Stephen Stanton AYO:[id number] DOB: May 19, 1960 DOA: 01/23/2013 PCP: No PCP Per Patient  Brief history  53 y/o with newly diagnosed DM who presented to the ER for shortness for breath which was of sudden onset along with a cough productive of yellow sputum. He was in [place] about a week prior and was diagnosed with DM in the ER and discharged home with Glyburide.  -he found to have HCAP/ampyema, AKI    Assessment/Plan:  Principal Problem:  1. 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 A-fib; a single episode of A-fib with RVR occuring in the ICU- appears to have received an Amiodarone load  - ECHO reveals normal LV function with a mildly dilated right atrium  - would hold off on chronic anticoagulation at this time as the episode was likely influenced by current respiratory issues   3. DM (diabetes mellitus) h/o of DM not on meds; recent A1c is 14.2  - cont current dose of Lantus and sliding scale  - started insulin teaching, consult Diabetes coordinator   4. Acute renal failure question ATN vs Prerenal on top of DM nephropathy  - renal ultrasound suggests chronic medical renal disease  - appreciate renal eval- Cr now improving- as long as it continues to trend downward, he may be  -defer diuresis to nephrology   5. HTN (hypertension) no prior diagnosis of HTN; likely renal induced  - initiated low dose Norvasc today  - ideally would like to start and ACE i but this will need to be done as outpt once Cr improves to baseline    D/c plans per nephrology   Code Status: full Family Communication: d/w patient, his wife (indicate person spoken with, relationship, and if by phone, the number) Disposition Plan: home in 24-48 hours     Consultants:  None   Procedures: 12/25- intubated- right Chest Tube placement  Antibiotics:  Cefepime 12/25 x 1  Zosyn 12/25 >>>12/31  Vancomycin 12/25 >>> 12/30  Augmentin 12/31 >>    HPI/Subjective: alert  Objective: Filed Vitals:   02/03/13 0926  BP: 143/88  Pulse: 78  Temp: 98.4 F (36.9 C)  Resp: 20    Intake/Output Summary (Last 24 hours) at 02/03/13 1129 Last data filed at 02/03/13 0927  Gross per 24 hour  Intake   2440 ml  Output   8025 ml  Net  -5585 ml   Filed Weights   02/01/13 0500 02/01/13 1600 02/02/13 1500  Weight: 114.3 kg (251 lb 15.8 oz) 114.805 kg (253 lb 1.6 oz) 113.8 kg (250 lb 14.1 oz)    Exam:   General:  alert  Cardiovascular: s1,s2 rrr  Respiratory: R LL crackles   Abdomen: soft, nt, nd   Musculoskeletal: edema    Data Reviewed: Basic Metabolic Panel:  Recent Labs Lab 01/29/13 1500 01/30/13 0500 01/31/13 0500 02/01/13 0930 02/02/13 0600  NA 145 147 144 140 142  K 3.3* 3.2* 3.3* 3.7 3.8  CL 102 102 105 102 105  CO2 28 27 27 25 23   GLUCOSE 139* 83 167* 177* 166*  BUN 46* 45* 43* 41* 51*  CREATININE 3.83* 4.11* 4.16* 4.18* 4.08*  CALCIUM 8.2* 8.4 7.9* 8.1* 8.2*  MG  --   --  2.0  --   --  Liver Function Tests: No results found for this basename: AST, ALT, ALKPHOS, BILITOT, PROT, ALBUMIN,  in the last 168 hours No results found for this basename: LIPASE, AMYLASE,  in the last 168 hours No results found for this basename: AMMONIA,  in the last 168 hours CBC:  Recent Labs Lab 01/28/13 0434 01/29/13 0500 01/30/13 0500 01/31/13 0500  WBC 10.4 12.2* 13.5* 11.5*  NEUTROABS  --  9.9* 10.8*  --   HGB 9.4* 9.5* 10.1* 8.9*  HCT 29.9* 30.4* 31.3* 29.0*  MCV 86.2 84.0 83.7 85.0  PLT 209 197 267 270   Cardiac Enzymes: No results found for this basename: CKTOTAL, CKMB, CKMBINDEX, TROPONINI,  in the last 168 hours BNP (last 3 results)  Recent Labs  01/23/13 1410 01/23/13 1749  PROBNP 488.1* 1140.0*    CBG:  Recent Labs Lab 02/02/13 0731 02/02/13 1141 02/02/13 1753 02/02/13 2311 02/03/13 0751  GLUCAP 143* 148* 92 181* 159*    No results found for this or any previous visit (from the past 240 hour(s)).   Studies: No results found.  Scheduled Meds: . amLODipine  5 mg Oral Daily  . amoxicillin-clavulanate  1 tablet Oral Q12H  . bd getting started take home kit  1 kit Other Once  . heparin subcutaneous  5,000 Units Subcutaneous Q8H  . insulin aspart  0-15 Units Subcutaneous TID WC  . insulin glargine  30 Units Subcutaneous Daily  . living well with diabetes book   Does not apply Once   Continuous Infusions:   Principal Problem:   Acute respiratory failure Active Problems:   HCAP (healthcare-associated pneumonia)   Empyema   DM (diabetes mellitus)   Hypokalemia   A-fib   Acute renal failure   HTN (hypertension)    Time spent: >35 minutes     Kinnie Feil  [facility] Pager [phone]. If 7PM-7AM, please contact night-coverage at [email or web address], password [facility] 02/03/2013, 11:29 AM  LOS: 11 days

## 2013-02-04 LAB — GLUCOSE, CAPILLARY
GLUCOSE-CAPILLARY: 276 mg/dL — AB (ref 70–99)
Glucose-Capillary: 149 mg/dL — ABNORMAL HIGH (ref 70–99)
Glucose-Capillary: 183 mg/dL — ABNORMAL HIGH (ref 70–99)
Glucose-Capillary: 183 mg/dL — ABNORMAL HIGH (ref 70–99)

## 2013-02-04 LAB — RENAL FUNCTION PANEL
ALBUMIN: 2 g/dL — AB (ref 3.5–5.2)
BUN: 50 mg/dL — ABNORMAL HIGH (ref 6–23)
CO2: 28 mEq/L (ref 19–32)
CREATININE: 3.84 mg/dL — AB (ref 0.50–1.35)
Calcium: 8.4 mg/dL (ref 8.4–10.5)
Chloride: 99 mEq/L (ref 96–112)
GFR calc Af Amer: 20 mL/min — ABNORMAL LOW (ref >90)
GFR, EST NON AFRICAN AMERICAN: 17 mL/min — AB (ref >90)
Glucose, Bld: 173 mg/dL — ABNORMAL HIGH (ref 70–99)
POTASSIUM: 3.4 meq/L — AB (ref 3.7–5.3)
Phosphorus: 4.3 mg/dL (ref 2.3–4.6)
Sodium: 142 mEq/L (ref 137–147)

## 2013-02-04 MED ORDER — POTASSIUM CHLORIDE CRYS ER 20 MEQ PO TBCR
40.0000 meq | EXTENDED_RELEASE_TABLET | Freq: Once | ORAL | Status: AC
  Administered 2013-02-04: 40 meq via ORAL
  Filled 2013-02-04: qty 2

## 2013-02-04 NOTE — Progress Notes (Signed)
[facility] PROGRESS NOTE  Stephen Stanton QZE:[id number] DOB: Dec 27, 1960 DOA: 01/23/2013 PCP: No PCP Per Patient  Brief history  53 y/o with newly diagnosed DM who presented to the ER for shortness for breath which was of sudden onset along with a cough productive of yellow sputum. He was in [place] about a week prior and was diagnosed with DM in the ER and discharged home with Glyburide.  -he found to have HCAP/ampyema, AKI    Assessment/Plan:  Principal Problem:  1. 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 A-fib; a single episode of A-fib with RVR occuring in the ICU- appears to have received an Amiodarone load  - ECHO reveals normal LV function with a mildly dilated right atrium  - would hold off on chronic anticoagulation at this time as the episode was likely influenced by current respiratory issues   3. DM (diabetes mellitus) h/o of DM not on meds; recent A1c is 14.2  - cont current dose of Lantus and sliding scale  - started insulin teaching, consult Diabetes coordinator   4. Acute renal failure question ATN vs Prerenal on top of DM nephropathy  - renal ultrasound suggests chronic medical renal disease  - appreciate renal eval- Cr now improving - defer diuresis to nephrology   5. HTN (hypertension) no prior diagnosis of HTN; likely renal induced  - initiated low dose Norvasc today  - ideally would like to start and ACE i but this will need to be done as outpt once Cr improves to baseline    D/c plans per nephrology   Code Status: full Family Communication: d/w patient, his wife (indicate person spoken with, relationship, and if by phone, the number) Disposition Plan: home in 24-48 hours    Consultants:  None   Procedures: 12/25- intubated- right  Chest Tube placement  Antibiotics:  Cefepime 12/25 x 1  Zosyn 12/25 >>>12/31  Vancomycin 12/25 >>> 12/30  Augmentin 12/31 >>    HPI/Subjective: alert  Objective: Filed Vitals:   02/04/13 0542  BP: 147/97  Pulse: 84  Temp: 98.5 F (36.9 C)  Resp: 20    Intake/Output Summary (Last 24 hours) at 02/04/13 1039 Last data filed at 02/04/13 1031  Gross per 24 hour  Intake    360 ml  Output   4625 ml  Net  -4265 ml   Filed Weights   02/01/13 1600 02/02/13 1500 02/04/13 0542  Weight: 114.805 kg (253 lb 1.6 oz) 113.8 kg (250 lb 14.1 oz) 111.4 kg (245 lb 9.5 oz)    Exam:   General:  alert  Cardiovascular: s1,s2 rrr  Respiratory: R LL crackles   Abdomen: soft, nt, nd   Musculoskeletal: edema    Data Reviewed: Basic Metabolic Panel:  Recent Labs Lab 01/30/13 0500 01/31/13 0500 02/01/13 0930 02/02/13 0600 02/04/13 0605  NA 147 144 140 142 142  K 3.2* 3.3* 3.7 3.8 3.4*  CL 102 105 102 105 99  CO2 27 27 25 23 28   GLUCOSE 83 167* 177* 166* 173*  BUN 45* 43* 41* 51* 50*  CREATININE 4.11* 4.16* 4.18* 4.08* 3.84*  CALCIUM 8.4 7.9* 8.1* 8.2* 8.4  MG  --  2.0  --   --   --   PHOS  --   --   --   --  4.3   Liver Function Tests:  Recent Labs Lab 02/04/13 0605  ALBUMIN 2.0*   No results found for this basename: LIPASE, AMYLASE,  in the last 168 hours No results found for this basename: AMMONIA,  in the last 168 hours CBC:  Recent Labs Lab 01/29/13 0500 01/30/13 0500 01/31/13 0500  WBC 12.2* 13.5* 11.5*  NEUTROABS 9.9* 10.8*  --   HGB 9.5* 10.1* 8.9*  HCT 30.4* 31.3* 29.0*  MCV 84.0 83.7 85.0  PLT 197 267 270   Cardiac Enzymes: No results found for this basename: CKTOTAL, CKMB, CKMBINDEX, TROPONINI,  in the last 168 hours BNP (last 3 results)  Recent Labs  01/23/13 1410 01/23/13 1749  PROBNP 488.1* 1140.0*   CBG:  Recent Labs Lab 02/03/13 0751 02/03/13 1142 02/03/13 1707 02/03/13 2059 02/04/13 0746  GLUCAP 159* 209* 101* 92 149*     No results found for this or any previous visit (from the past 240 hour(s)).   Studies: No results found.  Scheduled Meds: . amLODipine  5 mg Oral Daily  . amoxicillin-clavulanate  1 tablet Oral Q12H  . bd getting started take home kit  1 kit Other Once  . heparin subcutaneous  5,000 Units Subcutaneous Q8H  . insulin aspart  0-15 Units Subcutaneous TID WC  . insulin glargine  30 Units Subcutaneous Daily  . living well with diabetes book   Does not apply Once   Continuous Infusions:   Principal Problem:   Acute respiratory failure Active Problems:   HCAP (healthcare-associated pneumonia)   Empyema   DM (diabetes mellitus)   Hypokalemia   A-fib   Acute renal failure   HTN (hypertension)    Time spent: >35 minutes     Kinnie Feil  [facility] Pager [phone]. If 7PM-7AM, please contact night-coverage at [email or web address], password [facility] 02/04/2013, 10:39 AM  LOS: 12 days

## 2013-02-04 NOTE — Progress Notes (Signed)
S: Feels well.  Drinking plenty of fluids according to him O:BP 155/91  Pulse 82  Temp(Src) 98.7 F (37.1 C) (Oral)  Resp 18  Ht 6' 1"  (1.854 m)  Wt 111.4 kg (245 lb 9.5 oz)  BMI 32.41 kg/m2  SpO2 99%  Intake/Output Summary (Last 24 hours) at 02/04/13 1412 Last data filed at 02/04/13 1401  Gross per 24 hour  Intake    480 ml  Output   4825 ml  Net  -4345 ml   Weight change: -2.4 kg (-5 lb 4.7 oz) PQD:IYMEB and alert CVS: RRR Resp:few basilar crackles on Rt Abd:+ BS NTND Ext: no edema NEURO:CNI Ox3 no asterixis   . amLODipine  5 mg Oral Daily  . amoxicillin-clavulanate  1 tablet Oral Q12H  . bd getting started take home kit  1 kit Other Once  . heparin subcutaneous  5,000 Units Subcutaneous Q8H  . insulin aspart  0-15 Units Subcutaneous TID WC  . insulin glargine  30 Units Subcutaneous Daily  . living well with diabetes book   Does not apply Once   No results found. BMET    Component Value Date/Time   NA 142 02/04/2013 0605   K 3.4* 02/04/2013 0605   CL 99 02/04/2013 0605   CO2 28 02/04/2013 0605   GLUCOSE 173* 02/04/2013 0605   BUN 50* 02/04/2013 0605   CREATININE 3.84* 02/04/2013 0605   CALCIUM 8.4 02/04/2013 0605   GFRNONAA 17* 02/04/2013 0605   GFRAA 20* 02/04/2013 0605   CBC    Component Value Date/Time   WBC 11.5* 01/31/2013 0500   RBC 3.41* 01/31/2013 0500   HGB 8.9* 01/31/2013 0500   HCT 29.0* 01/31/2013 0500   PLT 270 01/31/2013 0500   MCV 85.0 01/31/2013 0500   MCH 26.1 01/31/2013 0500   MCHC 30.7 01/31/2013 0500   RDW 13.9 01/31/2013 0500   LYMPHSABS 1.9 01/30/2013 0500   MONOABS 0.7 01/30/2013 0500   EOSABS 0.1 01/30/2013 0500   BASOSABS 0.0 01/30/2013 0500     Assessment: 1. ARF presumably sec ATN. Scr trending down with UO was excellent.  He appears to be in the recovery phase of ATN 2. Empyema 3. A. Fib, now in NSR 4. DM 5. HTN 6. hypokalemia  Plan: 1.  He is diuresing on his own 2.  If Scr is lower tomorrow then he could probably be DC'd.  He has appointment with A PCP  in [facility] for next week  3. Replace K 4. Recheck labs in AM [facility] [place] [facility]

## 2013-02-05 LAB — GLUCOSE, CAPILLARY
GLUCOSE-CAPILLARY: 144 mg/dL — AB (ref 70–99)
GLUCOSE-CAPILLARY: 183 mg/dL — AB (ref 70–99)
GLUCOSE-CAPILLARY: 105 mg/dL — AB (ref 70–99)
GLUCOSE-CAPILLARY: 150 mg/dL — AB (ref 70–99)

## 2013-02-05 LAB — BASIC METABOLIC PANEL
BUN: 45 mg/dL — ABNORMAL HIGH (ref 6–23)
CALCIUM: 8.7 mg/dL (ref 8.4–10.5)
CO2: 29 mEq/L (ref 19–32)
CREATININE: 3.49 mg/dL — AB (ref 0.50–1.35)
Chloride: 96 mEq/L (ref 96–112)
GFR, EST AFRICAN AMERICAN: 22 mL/min — AB (ref >90)
GFR, EST NON AFRICAN AMERICAN: 19 mL/min — AB (ref >90)
Glucose, Bld: 170 mg/dL — ABNORMAL HIGH (ref 70–99)
Potassium: 3.6 mEq/L — ABNORMAL LOW (ref 3.7–5.3)
SODIUM: 141 meq/L (ref 137–147)

## 2013-02-05 MED ORDER — ASPIRIN 81 MG PO CHEW
81.0000 mg | CHEWABLE_TABLET | Freq: Every day | ORAL | Status: DC
  Administered 2013-02-05 – 2013-02-06 (×2): 81 mg via ORAL
  Filled 2013-02-05 (×2): qty 1

## 2013-02-05 NOTE — Progress Notes (Signed)
[facility] PROGRESS NOTE  Stephen Stanton LGX:[id number] DOB: [birth year] DOA: 01/23/2013 PCP: No PCP Per Patient  Brief history  53 y/o with newly diagnosed DM who presented to the ER for shortness for breath which was of sudden onset along with a cough productive of yellow sputum. He was in [place] about a week prior and was diagnosed with DM in the ER and discharged home with Glyburide.  -he found to have HCAP/ampyema, AKI    Assessment/Plan:  Principal Problem:  1. 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 A-fib; a single episode of A-fib with RVR occuring in the ICU- appears to have received an Amiodarone load  - ECHO reveals normal LV function with a mildly dilated right atrium  - holding off on full chronic anticoagulation at this time as the episode was likely influenced by current respiratory issues  -Place on aspirin.  3. DM (diabetes mellitus) h/o of DM not on meds; recent A1c is 14.2  - cont current dose of Lantus and sliding scale  -  insulin teaching, consult Diabetes coordinator   4. Acute renal failure question ATN vs Prerenal on top of DM nephropathy  - renal ultrasound suggests chronic medical renal disease  - diuresis per nephrology, appreciate the assistance -Appreciate renal assistance, await followup creatinine for today>> and is to DC if continuing to improve.    5. HTN (hypertension) no prior diagnosis of HTN; likely renal induced  - initiated low dose Norvasc today  - ideally would like to start and ACE i but this will need to be done as outpt once Cr improves to baseline    D/c plans per nephrology   Code Status: full Family Communication: d/w patient, his wife (indicate person spoken with, relationship, and if by phone, the  number) Disposition Plan: home in 24-48 hours    Consultants:  None   Procedures: 12/25- intubated- right Chest Tube placement  Antibiotics:  Cefepime 12/25 x 1  Zosyn 12/25 >>>12/31  Vancomycin 12/25 >>> 12/30  Augmentin 12/31 >>    HPI/Subjective: Patient chest pain and no shortness of breath. Also denies nausea vomiting, abdominal pain  Objective: Filed Vitals:   02/05/13 1305  BP: 158/98  Pulse: 72  Temp: 99.1 F (37.3 C)  Resp: 18    Intake/Output Summary (Last 24 hours) at 02/05/13 1819 Last data filed at 02/05/13 1651  Gross per 24 hour  Intake   2042 ml  Output   7060 ml  Net  -5018 ml   Filed Weights   02/02/13 1500 02/04/13 0542 02/05/13 0554  Weight: 113.8 kg (250 lb 14.1 oz) 111.4 kg (245 lb 9.5 oz) 107.502 kg (237 lb)    Exam:   General:  alert and oriented x3  Cardiovascular: s1,s2 rrr  Respiratory: Decreased breath sounds at bases   Abdomen: soft, nt, nd   Musculoskeletal: No edema   Data Reviewed: Basic Metabolic Panel:  Recent Labs Lab 01/31/13 0500 02/01/13 0930 02/02/13 0600 02/04/13 0605 02/05/13 1402  NA 144 140 142 142 141  K 3.3* 3.7 3.8 3.4* 3.6*  CL 105 102 105 99 96  CO2 _0 GLUCOSE 167* 177* 166* 173* 170*  BUN 43* 41* 51* 50* 45*  CREATININE 4.16* 4.18* 4.08* 3.84* 3.49*  CALCIUM 7.9* 8.1* 8.2* 8.4 8.7  MG 2.0  --   --   --   --   PHOS  --   --   --  4.3  --    Liver Function Tests:  Recent Labs Lab 02/04/13 0605  ALBUMIN 2.0*   No results found for this basename: LIPASE, AMYLASE,  in the last 168 hours No results found for this basename: AMMONIA,  in the last 168 hours CBC:  Recent Labs Lab 01/30/13 0500 01/31/13 0500  WBC 13.5* 11.5*  NEUTROABS 10.8*  --   HGB 10.1* 8.9*  HCT 31.3* 29.0*  MCV 83.7 85.0  PLT 267 270   Cardiac Enzymes: No results found for this basename: CKTOTAL, CKMB, CKMBINDEX, TROPONINI,  in the last 168 hours BNP (last 3 results)  Recent Labs   01/23/13 1410 01/23/13 1749  PROBNP 488.1* 1140.0*   CBG:  Recent Labs Lab 02/04/13 1706 02/04/13 2207 02/05/13 0738 02/05/13 1233 02/05/13 1647  GLUCAP 183* 276* 144* 183* 105*    No results found for this or any previous visit (from the past 240 hour(s)).   Studies: No results found.  Scheduled Meds: . amLODipine  5 mg Oral Daily  . amoxicillin-clavulanate  1 tablet Oral Q12H  . bd getting started take home kit  1 kit Other Once  . heparin subcutaneous  5,000 Units Subcutaneous Q8H  . insulin aspart  0-15 Units Subcutaneous TID WC  . insulin glargine  30 Units Subcutaneous Daily  . living well with diabetes book   Does not apply Once   Continuous Infusions:   Principal Problem:   Acute respiratory failure Active Problems:   HCAP (healthcare-associated pneumonia)   Empyema   DM (diabetes mellitus)   Hypokalemia   A-fib   Acute renal failure   HTN (hypertension)    Time spent: 25 minutes     [facility] Pager [phone]. If 7PM-7AM, please contact night-coverage at [email or web address], password [facility] 02/05/2013, 6:19 PM  LOS: 13 days

## 2013-02-05 NOTE — Progress Notes (Signed)
S: Feels well.  Drinking plenty of fluids according to him O:BP 158/98  Pulse 72  Temp(Src) 99.1 F (37.3 C) (Oral)  Resp 18  Ht 6' 1"  (1.854 m)  Wt 107.502 kg (237 lb)  BMI 31.27 kg/m2  SpO2 100%  Intake/Output Summary (Last 24 hours) at 02/05/13 1358 Last data filed at 02/05/13 1306  Gross per 24 hour  Intake    840 ml  Output   6435 ml  Net  -5595 ml   Weight change: -3.898 kg (-8 lb 9.5 oz) PJR:PZPSU and alert CVS: RRR Resp:few basilar crackles on Rt with decreased BS Abd:+ BS NTND Ext: no edema NEURO:CNI Ox3 no asterixis   . amLODipine  5 mg Oral Daily  . amoxicillin-clavulanate  1 tablet Oral Q12H  . bd getting started take home kit  1 kit Other Once  . heparin subcutaneous  5,000 Units Subcutaneous Q8H  . insulin aspart  0-15 Units Subcutaneous TID WC  . insulin glargine  30 Units Subcutaneous Daily  . living well with diabetes book   Does not apply Once   No results found. BMET    Component Value Date/Time   NA 142 02/04/2013 0605   K 3.4* 02/04/2013 0605   CL 99 02/04/2013 0605   CO2 28 02/04/2013 0605   GLUCOSE 173* 02/04/2013 0605   BUN 50* 02/04/2013 0605   CREATININE 3.84* 02/04/2013 0605   CALCIUM 8.4 02/04/2013 0605   GFRNONAA 17* 02/04/2013 0605   GFRAA 20* 02/04/2013 0605   CBC    Component Value Date/Time   WBC 11.5* 01/31/2013 0500   RBC 3.41* 01/31/2013 0500   HGB 8.9* 01/31/2013 0500   HCT 29.0* 01/31/2013 0500   PLT 270 01/31/2013 0500   MCV 85.0 01/31/2013 0500   MCH 26.1 01/31/2013 0500   MCHC 30.7 01/31/2013 0500   RDW 13.9 01/31/2013 0500   LYMPHSABS 1.9 01/30/2013 0500   MONOABS 0.7 01/30/2013 0500   EOSABS 0.1 01/30/2013 0500   BASOSABS 0.0 01/30/2013 0500     Assessment: 1. ARF presumably sec ATN. Scr was improving, no labs today 2. Empyema 3. A. Fib, now in NSR 4. DM 5. HTN 6. hypokalemia  Plan: 1.   Will get BMET stat.  If Scr lower then He can go from my standpoint.  I f he goes today let me know so I can arrange labs for Fri.  He has appt with his PCP  next week.  As long as renal fx cont to improve he does not need to FU with me.  His renal fx was normal prior to this insult and I anticipate it to return to normal.  If not, his PCP can refer him back.  #[phone] Lataya Varnell T

## 2013-02-06 LAB — GLUCOSE, CAPILLARY
GLUCOSE-CAPILLARY: 168 mg/dL — AB (ref 70–99)
Glucose-Capillary: 91 mg/dL (ref 70–99)
Glucose-Capillary: 100 mg/dL — ABNORMAL HIGH (ref 70–99)

## 2013-02-06 LAB — BASIC METABOLIC PANEL
BUN: 51 mg/dL — ABNORMAL HIGH (ref 6–23)
CALCIUM: 8.3 mg/dL — AB (ref 8.4–10.5)
CO2: 29 mEq/L (ref 19–32)
CREATININE: 3.46 mg/dL — AB (ref 0.50–1.35)
Chloride: 92 mEq/L — ABNORMAL LOW (ref 96–112)
GFR calc Af Amer: 22 mL/min — ABNORMAL LOW (ref >90)
GFR, EST NON AFRICAN AMERICAN: 19 mL/min — AB (ref >90)
Glucose, Bld: 232 mg/dL — ABNORMAL HIGH (ref 70–99)
Potassium: 3.6 mEq/L — ABNORMAL LOW (ref 3.7–5.3)
SODIUM: 136 meq/L — AB (ref 137–147)

## 2013-02-06 MED ORDER — INSULIN GLARGINE 100 UNIT/ML SOLOSTAR PEN: 30.0000 [IU] | PEN_INJECTOR | Freq: Every day | SUBCUTANEOUS | Status: AC

## 2013-02-06 MED ORDER — METOPROLOL TARTRATE 25 MG PO TABS: 12.5000 mg | ORAL_TABLET | Freq: Two times a day (BID) | ORAL | Status: AC

## 2013-02-06 MED ORDER — ASPIRIN 81 MG PO CHEW: 81.0000 mg | CHEWABLE_TABLET | Freq: Every day | ORAL | Status: AC

## 2013-02-06 MED ORDER — "PEN NEEDLES 1/2"" 29G X 12MM MISC": Status: AC

## 2013-02-06 MED ORDER — LIVING WELL WITH DIABETES BOOK: 1.0000 | Freq: Once | Status: AC

## 2013-02-06 MED ORDER — AMOXICILLIN-POT CLAVULANATE 250-125 MG PO TABS: 1.0000 | ORAL_TABLET | Freq: Two times a day (BID) | ORAL | Status: AC

## 2013-02-06 NOTE — Discharge Summary (Addendum)
Physician Discharge Summary  Stephen Stanton ZOX:[id number] DOB: [birth year] DOA: 01/23/2013  PCP: No PCP Per Patient  Admit date: 01/23/2013 Discharge date: 02/06/2013  Time spent: >30 minutes  Recommendations for Outpatient Follow-up:  Follow-up Information   Please follow up. (PCP next week, as scheduled.)       Follow up with MATTINGLY,MICHAEL T, MD. (As directed)    Specialty:  Nephrology   Contact information:   [address] [place] [address] [phone]         Discharge Diagnoses:  Principal Problem:   Acute respiratory failure Active Problems:   HCAP (healthcare-associated pneumonia)   Empyema   DM (diabetes mellitus)   Hypokalemia   A-fib   Acute renal failure   HTN (hypertension)   Discharge Condition: Improved/stable  Diet recommendation: Modified carbohydrate  Filed Weights   02/04/13 0542 02/05/13 0554 02/06/13 0500  Weight: 111.4 kg (245 lb 9.5 oz) 107.502 kg (237 lb) 107.14 kg (236 lb 3.2 oz)    History of present illness:  Pt is a 53 yo with past medical history of new onset diabetes ( brief admission one week ago) brought to [facility] ED with with dyspnea and fever. In ED required BiPAP. Chest imaging demonstrated bilateral pneumonia and likely empyema on the R. Transferred [facility] for further management.   Hospital Course:  1. Acute respiratory failure requiring intubation / HCAP/ Empyema  -As discussed above, CT angio of chest on admission showed extensive bilateral pneumonia right greater than left and a large empyema with air-fluid levels on the right>> he was admitted to CCM service>> required right chest tube- removed by per PCCM on 1/2  -Patient was placed on empiric IV antibiotics per CCM and as he improved and was subsequently changed to Augmentin and a total of a 3 week course recommended. He improved clinically he was transferred to the medical floor and TRH service. -Has continued to do well-afebrile, hemodynamically stable and last white cell  count 11.5. 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 A-fib; a single episode of A-fib with RVR occuring in the ICU- appears to have received an Amiodarone load  - ECHO reveals normal LV function with a mildly dilated right atrium  -He has remained in sinus rhythm, be discharged on low dose metoprolol As well as aspirin and is to follow up outpatient.  3. DM (diabetes mellitus) h/o of DM not on meds; recent A1c is 14.2  - cont current dose of Lantus - Patient received insulin teaching, she is to continue Lantus on discharge and followup with his PCP for further monitoring and adjustment of his Lantus dose is clinically appropriate for optimal blood glucose control. 4. 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 Briant Cedar okay for discharge today and he is set up to follow up outpatient with 3 5. HTN (hypertension) no prior diagnosis of HTN; likely renal induced  - ideally would like to start and ACEI but this will need to be done as outpt once Cr improves to baseline  -He is been discharged on metoprolol as discussed above     Procedures:  12/25- intubated- right Chest Tube placement    Consultations:  Renal  Patient was initially admitted to the CCM service the  Discharge Exam: Filed Vitals:   02/06/13 1344  BP: 130/82  Pulse: 82  Temp: 98.6 F (37 C)  Resp: 20   Exam:  General: alert and oriented x3  Cardiovascular: s1,s2 rrr  Respiratory: Decreased breath sounds at bases  Abdomen: soft, nt, nd  Musculoskeletal: No edema   Discharge Instructions  Discharge Orders    Future Orders Complete By Expires   Ambulatory referral to Nutrition and Diabetic Education  As directed    Diet Carb Modified  As directed    Increase activity slowly  As directed        Medication List    STOP taking these medications       glyBURIDE 2.5 MG tablet  Commonly known as:  DIABETA     ibuprofen 800 MG tablet  Commonly known as:  ADVIL,MOTRIN      TAKE these medications       amoxicillin-clavulanate 250-125 MG per tablet  Commonly known as:  AUGMENTIN  Take 1 tablet by mouth every 12 (twelve) hours.     aspirin 81 MG chewable tablet  Chew 1 tablet (81 mg total) by mouth daily.     Insulin Glargine 100 UNIT/ML Solostar Pen  Commonly known as:  LANTUS  Inject 30 Units into the skin daily.     living well with diabetes book Misc  1 each by Does not apply route once.     metoprolol tartrate 25 MG tablet  Commonly known as:  LOPRESSOR  Take 0.5 tablets (12.5 mg total) by mouth 2 (two) times daily.     PEN NEEDLES 29GX1/2" 29G X Misc  For SQ insulin       No Known Allergies    The results of significant diagnostics from this hospitalization (including imaging, microbiology, ancillary and laboratory) are listed below for reference.    Significant Diagnostic Studies: Ct Chest Wo Contrast  01/26/2013   CLINICAL DATA:  Shortness of breath and fever.  EXAM: CT CHEST WITHOUT CONTRAST  TECHNIQUE: Multidetector CT imaging of the chest was performed following the standard protocol without IV contrast.  COMPARISON:  01/23/2013  FINDINGS: There has been interval placement of a right-sided chest tube. There has been near-complete resolution of previous right empyema. No significant pneumothorax identified. Diffuse bilateral airspace opacities are identified consistent with multifocal pneumonia.  The trachea appears patent and is midline. The heart size appears normal. No pericardial effusion identified. There is no enlarged supraclavicular or axillary adenopathy.   Review of the visualized osseous structures is unremarkable.  Incidental imaging through the upper abdomen shows no acute findings.  IMPRESSION: 1. Interval placement of right chest tube with near complete resolution of right-sided empyema. 2. Diffuse bilateral airspace opacities consistent with multifocal, bilateral pneumonia.   Electronically Signed   By: Signa Kell M.D.   On: 01/26/2013 16:05   Ct Angio Chest Pe W/cm &/or Wo Cm  01/23/2013   CLINICAL DATA:  Fever.  Leg swelling.  Diabetes.  Short of breath.  EXAM: CT ANGIOGRAPHY CHEST WITH CONTRAST  TECHNIQUE: Multidetector CT imaging of the chest was performed using the standard protocol during bolus administration of intravenous contrast. Multiplanar CT image reconstructions including MIPs were obtained to evaluate the vascular anatomy.  CONTRAST:  OMNIPAQUE IOHEXOL 350 MG/ML SOLN  COMPARISON:  Chest x-ray today.  Chest CT 12/27/2012  FINDINGS: The patient was  not able to hold still and there is considerable motion on the study. The study is of marginal quality for pulmonary embolism due to motion. No central pulmonary emboli are identified. Small distal emboli could be missed on the study. There are some questionable filling defects the left upper lobe which are attributable to motion.  Extensive infiltrate throughout most of the right lung with air bronchograms consistent with pneumonia. Left lower lobe infiltrate with air bronchograms consistent with pneumonia.  Large loculated right pleural effusion containing an air-fluid level. This is most consistent with empyema. No significant pleural effusion on the left.  Negative for mass or adenopathy.  No acute bony changes.  Review of the MIP images confirms the above findings.  IMPRESSION: Extensive motion limits the ability to detect pulmonary emboli on the study. No large central emboli are identified.  Extensive bilateral pneumonia, right greater than left.  Large empyema with air-fluid level on  the right.  The findings were discussed by telephone between Dr. Signa Kell and Dr. Tyson Alias.   Electronically Signed   By: Marlan Palau M.D.   On: 01/23/2013 16:09   US Renal  01/31/2013   CLINICAL DATA:  Renal failure.  EXAM: RENAL/URINARY TRACT ULTRASOUND COMPLETE  COMPARISON:  None.  FINDINGS: Right Kidney:  Length: 13.7 cm. Right kidney is diffusely hyperechoic consistent with chronic medical renal disease. No hydronephrosis. No mass.  Left Kidney:  Length: 14.5 cm. Left kidney is diffusely hyperechoic consistent with chronic medical renal disease. No hydronephrosis. No mass.  Bladder:  Appears normal for degree of bladder distention.  IMPRESSION: Both kidneys are hyperechoic suggesting chronic medical renal disease. No hydronephrosis. No bladder distention.   Electronically Signed   By: Maisie Fus  Register   On: 01/31/2013 10:28   Dg Chest Port 1 View  02/01/2013   CLINICAL DATA:  Recently removed chest tube.  EXAM: PORTABLE CHEST - 1 VIEW  COMPARISON:  01/31/2013  FINDINGS: Elevation of the right hemidiaphragm with volume loss in the right lung. Central line tip is in the SVC region. Diffuse interstitial densities are suggestive for pulmonary edema and unchanged. No evidence for a pneumothorax. Heart size is stable.  IMPRESSION: Diffuse interstitial densities are suggestive for interstitial edema.  Low lung volumes, particularly in the right chest.   Electronically Signed   By: Richarda Overlie M.D.   On: 02/01/2013 07:55   Dg Chest Port 1 View  01/31/2013   CLINICAL DATA:  Status post chest tube removal  EXAM: PORTABLE CHEST - 1 VIEW  COMPARISON:  01/30/2013  FINDINGS: Right chest tube has been removed. No pneumothorax. Right central line stable.  Consolidation right base, unchanged. Bilateral multifocal infiltrates unchanged.  IMPRESSION: No pneumothorax status post right chest tube removal.   Electronically Signed   By: Esperanza Heir M.D.   On: 01/31/2013 17:46   Dg Chest Port 1 View  01/30/2013    CLINICAL DATA:  Pneumonia.  Right chest tube  EXAM: PORTABLE CHEST - 1 VIEW  COMPARISON:  Chest radiograph 01/29/2013 and CT chest 01/26/2013  FINDINGS: Right IJ central venous catheter is unchanged, projecting over the mid superior vena cava. A right chest tube projects over the right lung base, stable. Heart appears enlarged and the cardiomediastinal silhouette is stable.  Extensive bilateral airspace disease with areas of consolidation appears without significant change compared to chest radiograph of 01/29/2013. There is some sparing of the lung apices. No definite visible pleural effusion.  IMPRESSION: No significant change in bilateral, multilobar pneumonia compared  to chest radiograph of 01/29/2013.   Electronically Signed   By: Britta MccreedySusan  Turner M.D.   On: 01/30/2013 08:23   Dg Chest Port 1 View  01/29/2013   CLINICAL DATA:  Abnormal chest x-ray.  Possible catheter fragment.  EXAM: PORTABLE CHEST - 1 VIEW  COMPARISON:  One-view chest from the same day.  FINDINGS: The heart size is exaggerated by low lung volumes. A right IJ line is stable. Bilateral interstitial and airspace disease is unchanged. A right-sided chest tube is in place without pneumothorax.  The linear density seen on the prior exam the was no longer evident. This may have been external to the patient or superimposed shadows. No foreign body is evident.  IMPRESSION: 1. The previously noted density has cleared. 2. Stable support apparatus. 3. Persistent interstitial and airspace disease bilaterally compatible with pneumonia or ARDS.   Electronically Signed   By: Gennette Pachris  Mattern M.D.   On: 01/29/2013 08:35   Dg Chest Port 1 View  01/29/2013   CLINICAL DATA:  Chest tube.  EXAM: PORTABLE CHEST - 1 VIEW  COMPARISON:  Chest x-ray 01/28/2013.  Chest CT 01/26/2013.  FINDINGS: Interim removal of endotracheal tube and NG tube. Right IJ line in stable position. Right chest tube in stable position. No pneumothorax. Linear opacity is projected over the  right upper mediastinum. Although this could be lying outside the patient, a fractured catheter fragment cannot be excluded. Repeat chest x-ray is suggested. If this finding persists CT can be obtained . Small right pleural effusion. Persistent bilateral pulmonary alveolar infiltrates are noted consistent with persistent bilateral pneumonia and /or a process such as ARDS. Stable cardiomegaly, no pulmonary venous distention. No pneumothorax. No acute osseous abnormality.  IMPRESSION: 1. Interval removal of endotracheal tube and NG tube. Right IJ line and right chest tube in stable position . 2. Persistent bilateral pulmonary infiltrates consistent with bilateral pneumonia and/or ARDS. Small right pleural effusion. 3. A linear opacity noted projected over the upper right mediastinum, question etiology. This could be outside the patient. However a fractured catheter fragment could also present in this fashion. Repeat chest x-ray is suggested. If this persists CT can be obtained for further evaluation. These results will be called to the ordering clinician or representative by the Radiologist Assistant, and communication documented in the PACS Dashboard.   Electronically Signed   By: Maisie Fushomas  Register   On: 01/29/2013 07:39   Dg Chest Port 1 View  01/28/2013   CLINICAL DATA:  Ventilator dependence.  EXAM: PORTABLE CHEST - 1 VIEW  COMPARISON:  Multiple recent previous exams.  FINDINGS: 0534 hrs. Endotracheal tube tip is 2.7 cm above the base of the chronic. The right IJ central line tip overlies the mid SVC level. Right chest tube remains in place. The NG tube passes into the stomach although the distal tip position is not included on the film.  Lung volumes remain low. The central airspace disease seen in both lungs previously has decreased in the interval. There is persistent patchy bilateral airspace disease underlying, as before. Probable small bilateral pleural effusions are associated. Telemetry leads overlie  the chest. Gaseous distention of the left colon noted.  IMPRESSION: Interval decrease in the focal, central parahilar opacity seen on yesterday's film.  Residual underlying patchy bilateral airspace disease.   Electronically Signed   By: Kennith CenterEric  Mansell M.D.   On: 01/28/2013 08:21   Dg Chest Port 1 View  01/27/2013   CLINICAL DATA:  Check endotracheal tube position.  EXAM: PORTABLE  CHEST - 1 VIEW  COMPARISON:  01/25/2013  FINDINGS: Endotracheal tube tip measures 5.7 cm above the carina. Enteric tube tip is in the left upper quadrant consistent with location in the stomach. Right chest tube is unchanged in position. Right central venous catheter is unchanged. Shallow inspiration. Cardiac enlargement. Bilateral perihilar infiltrates/consolidation, with apparent progression on the left. No visualized effusion or pneumothorax.  IMPRESSION: Appliances positioned as described. Bilateral perihilar consolidation with progression on the left.   Electronically Signed   By: Burman Nieves M.D.   On: 01/27/2013 02:57   Dg Chest Port 1 View  01/25/2013   CLINICAL DATA:  Assess chest tube placement.  EXAM: PORTABLE CHEST - 1 VIEW  COMPARISON:  January 24, 2013  FINDINGS: The lungs are less well inflated today. There are persistent confluent alveolar densities occupying approximately 1/2 of the right lung inferiorly with patchy areas of confluent density on the left. The left hemidiaphragm is obscured. The chest tube on the right has its tip projecting inferiorly overlying the posterior aspect of the right 10th rib. This appearance is stable. The cardiopericardial silhouette remains mildly enlarged. The pulmonary vascularity is indistinct.  The endotracheal tube tip lies approximately 5 cm above the crotch of the carina. The right internal jugular venous catheter tip lies at the level of the junction of the right and left brachiocephalic veins. The esophagogastric tube tip and proximal port appear lie below the expected  level of the GE junction in the left upper quadrant of the abdomen.  IMPRESSION: 1. There is mild pulmonary hypo inflation today. Persistent confluent alveolar densities are present consistent with pneumonia or ARDS. 2. The support tubes and lines appear to be in appropriate position.   Electronically Signed   By: David  [place]   On: 01/25/2013 09:32   Dg Chest Port 1 View  01/24/2013   CLINICAL DATA:  About every endotracheal tube  EXAM: PORTABLE CHEST - 1 VIEW  COMPARISON:  01/23/2013; 12/27/2012; chest CT -12/24/2012  FINDINGS: Grossly unchanged borderline enlarged cardiac silhouette and mediastinal contours given persistently reduced lung volumes and patient rotation. Interval advancement of enteric tube with side port now projected over the expected location of the gastric fundus. Otherwise, stable positioning of remaining support apparatus. No pneumothorax. No change to minimally improved aeration of the lungs with extensive bilateral mid and lower lung predominant heterogeneous airspace opacities, right greater than left. Small bilateral pleural effusions are unchanged. Unchanged bones.  IMPRESSION: 1. Appropriately positioned support apparatus as above. No pneumothorax. 2. No change to minimally improved aeration of the lungs with persistent extensive bilateral mid and lower lung heterogeneous airspace opacities worrisome for multifocal infection.   Electronically Signed   By: Simonne Come M.D.   On: 01/24/2013 07:38   Dg Chest Port 1 View  01/23/2013   CLINICAL DATA:  Chest tube placement  EXAM: PORTABLE CHEST - 1 VIEW  COMPARISON:  01/23/2013  FINDINGS: Endotracheal tube in good position. Right jugular catheter in the SVC. No pneumothorax.  NG tube tip at the GE junction with the proximal side hole in distal esophagus.  Interval placement of right-sided chest tube in the right lung base. Decreased and right pleural effusion.  Extensive pneumonia in both lung bases again noted and unchanged.   IMPRESSION: Satisfactory central line placement.  Satisfactory right chest tube with decrease in right pleural effusion  Endotracheal tube remains in good position.  Advance NG tube.   Electronically Signed   By: Marlan Palau M.D.  On: 01/23/2013 19:18   Dg Chest Port 1 View  01/23/2013   CLINICAL DATA:  Intubation. Central venous catheter placement. Right lung pneumonia and right side empyema on earlier imaging.  EXAM: PORTABLE CHEST - 1 VIEW 01/23/2013 1809 hr:  COMPARISON:  Portable chest x-ray earlier same date 1402 hr and CTA chest earlier same date.  FINDINGS: Endotracheal tube tip in satisfactory position projecting approximately 6 cm above the carina. Right jugular central venous catheter tip projects over the upper SVC. No evidence of mediastinal hematoma or pneumothorax. Dense consolidation throughout the right lower lobe, right middle lobe, and much of the right upper lobe. Dense consolidation in the left lower lobe. No new pulmonary parenchymal abnormalities since earlier same date.  IMPRESSION: 1. Endotracheal tube tip in satisfactory position projecting approximately 6 cm above the carina. 2. Right jugular central venous catheter tip projects over the upper SVC. No acute complicating features. 3. Stable severe pneumonia involving much of the right lung and the left lower lobe since earlier in the day. No new abnormalities.   Electronically Signed   By: Hulan Saas M.D.   On: 01/23/2013 18:24   Dg Chest Port 1 View  01/23/2013   CLINICAL DATA:  Fever and shortness of breath  EXAM: PORTABLE CHEST - 1 VIEW  COMPARISON:  December 27, 2012 chest radiograph and chest CT angiogram  FINDINGS: There is a large pleural effusion occupying much of the right hemithorax. There is consolidation in both lower lobes. There is diffuse interstitial edema. Heart is mildly enlarged with pulmonary venous hypertension. No bony lesions are appreciated. No appreciable pneumothorax.  IMPRESSION: The findings  consistent with congestive heart failure. Note that there is a large pleural effusion on the right, occupying much of the right hemithorax. Superimposed pneumonia in the lower lobes cannot be excluded on this study.   Electronically Signed   By: Bretta Bang M.D.   On: 01/23/2013 14:27   Dg Abd Portable 1v  01/24/2013   CLINICAL DATA:  Orogastric tube placement  EXAM: PORTABLE ABDOMEN - 1 VIEW  COMPARISON:  None.  FINDINGS: Orogastric tube tip and side port are in the stomach. Bowel gas pattern overall is unremarkable. There is moderate stool in the colon. No free air seen. There is prominence of the liver. There is a tube with a side port overlying the right upper quadrant; its origin cannot be determined.  IMPRESSION: Orogastric tube tip and side port in stomach. Liver appears prominent. Bowel gas pattern is overall unremarkable. There is a tube with a side port overlying the right upper quadrant of uncertain etiology.   Electronically Signed   By: Bretta Bang M.D.   On: 01/24/2013 08:19    Microbiology: No results found for this or any previous visit (from the past 240 hour(s)).   Labs: Basic Metabolic Panel:  Recent Labs Lab 01/31/13 0500 02/01/13 0930 02/02/13 0600 02/04/13 0605 02/05/13 1402 02/06/13 0535  NA 144 140 142 142 141 136*  K 3.3* 3.7 3.8 3.4* 3.6* 3.6*  CL 105 102 105 99 96 92*  CO2 27 25 23 28 29 29   GLUCOSE 167* 177* 166* 173* 170* 232*  BUN 43* 41* 51* 50* 45* 51*  CREATININE 4.16* 4.18* 4.08* 3.84* 3.49* 3.46*  CALCIUM 7.9* 8.1* 8.2* 8.4 8.7 8.3*  MG 2.0  --   --   --   --   --   PHOS  --   --   --  4.3  --   --  Liver Function Tests:  Recent Labs Lab 02/04/13 0605  ALBUMIN 2.0*   No results found for this basename: LIPASE, AMYLASE,  in the last 168 hours No results found for this basename: AMMONIA,  in the last 168 hours CBC:  Recent Labs Lab 01/31/13 0500  WBC 11.5*  HGB 8.9*  HCT 29.0*  MCV 85.0  PLT 270   Cardiac Enzymes: No  results found for this basename: CKTOTAL, CKMB, CKMBINDEX, TROPONINI,  in the last 168 hours BNP: BNP (last 3 results)  Recent Labs  01/23/13 1410 01/23/13 1749  PROBNP 488.1* 1140.0*   CBG:  Recent Labs Lab 02/05/13 1233 02/05/13 1647 02/05/13 2127 02/06/13 0800 02/06/13 1210  GLUCAP 183* 105* 150* 168* 91       Signed:  Willo Yoon C  [facility] 02/06/2013, 3:30 PM

## 2013-02-06 NOTE — Progress Notes (Signed)
S: Feels well.  Drinking plenty of fluids.  Wants to go home O:BP 126/78  Pulse 77  Temp(Src) 98.8 F (37.1 C) (Oral)  Resp 18  Ht 6' 1" (1.854 m)  Wt 107.14 kg (236 lb 3.2 oz)  BMI 31.17 kg/m2  SpO2 98%  Intake/Output Summary (Last 24 hours) at 02/06/13 1213 Last data filed at 02/06/13 1100  Gross per 24 hour  Intake   5070 ml  Output   6375 ml  Net  -1305 ml   Weight change: -0.363 kg (-12.8 oz) Gen:awake and alert CVS: RRR Resp:few basilar crackles on Rt with decreased BS Abd:+ BS NTND Ext: no edema NEURO:CNI Ox3 no asterixis   . amLODipine  5 mg Oral Daily  . amoxicillin-clavulanate  1 tablet Oral Q12H  . aspirin  81 mg Oral Daily  . bd getting started take home kit  1 kit Other Once  . heparin subcutaneous  5,000 Units Subcutaneous Q8H  . insulin aspart  0-15 Units Subcutaneous TID WC  . insulin glargine  30 Units Subcutaneous Daily  . living well with diabetes book   Does not apply Once   No results found. BMET    Component Value Date/Time   NA 136* 02/06/2013 0535   K 3.6* 02/06/2013 0535   CL 92* 02/06/2013 0535   CO2 29 02/06/2013 0535   GLUCOSE 232* 02/06/2013 0535   BUN 51* 02/06/2013 0535   CREATININE 3.46* 02/06/2013 0535   CALCIUM 8.3* 02/06/2013 0535   GFRNONAA 19* 02/06/2013 0535   GFRAA 22* 02/06/2013 0535   CBC    Component Value Date/Time   WBC 11.5* 01/31/2013 0500   RBC 3.41* 01/31/2013 0500   HGB 8.9* 01/31/2013 0500   HCT 29.0* 01/31/2013 0500   PLT 270 01/31/2013 0500   MCV 85.0 01/31/2013 0500   MCH 26.1 01/31/2013 0500   MCHC 30.7 01/31/2013 0500   RDW 13.9 01/31/2013 0500   LYMPHSABS 1.9 01/30/2013 0500   MONOABS 0.7 01/30/2013 0500   EOSABS 0.1 01/30/2013 0500   BASOSABS 0.0 01/30/2013 0500     Assessment: 1. ARF presumably sec ATN. Scr was improving, no labs today 2. Empyema 3. A. Fib, now in NSR 4. DM 5. HTN 6. Hypokalemia, resolved  Plan: 1.   OK to DC from my standpoint 2. I have labs sent up to be done on MON  MATTINGLY,MICHAEL T 

## 2015-09-21 IMAGING — CR DG CHEST 2V
2 series · 2 of 2 positions shown · non-contrast
Comparison: 06/06/2007.

CLINICAL DATA: Shortness of breath.

EXAM:
CHEST  2 VIEW

[w chest pa]
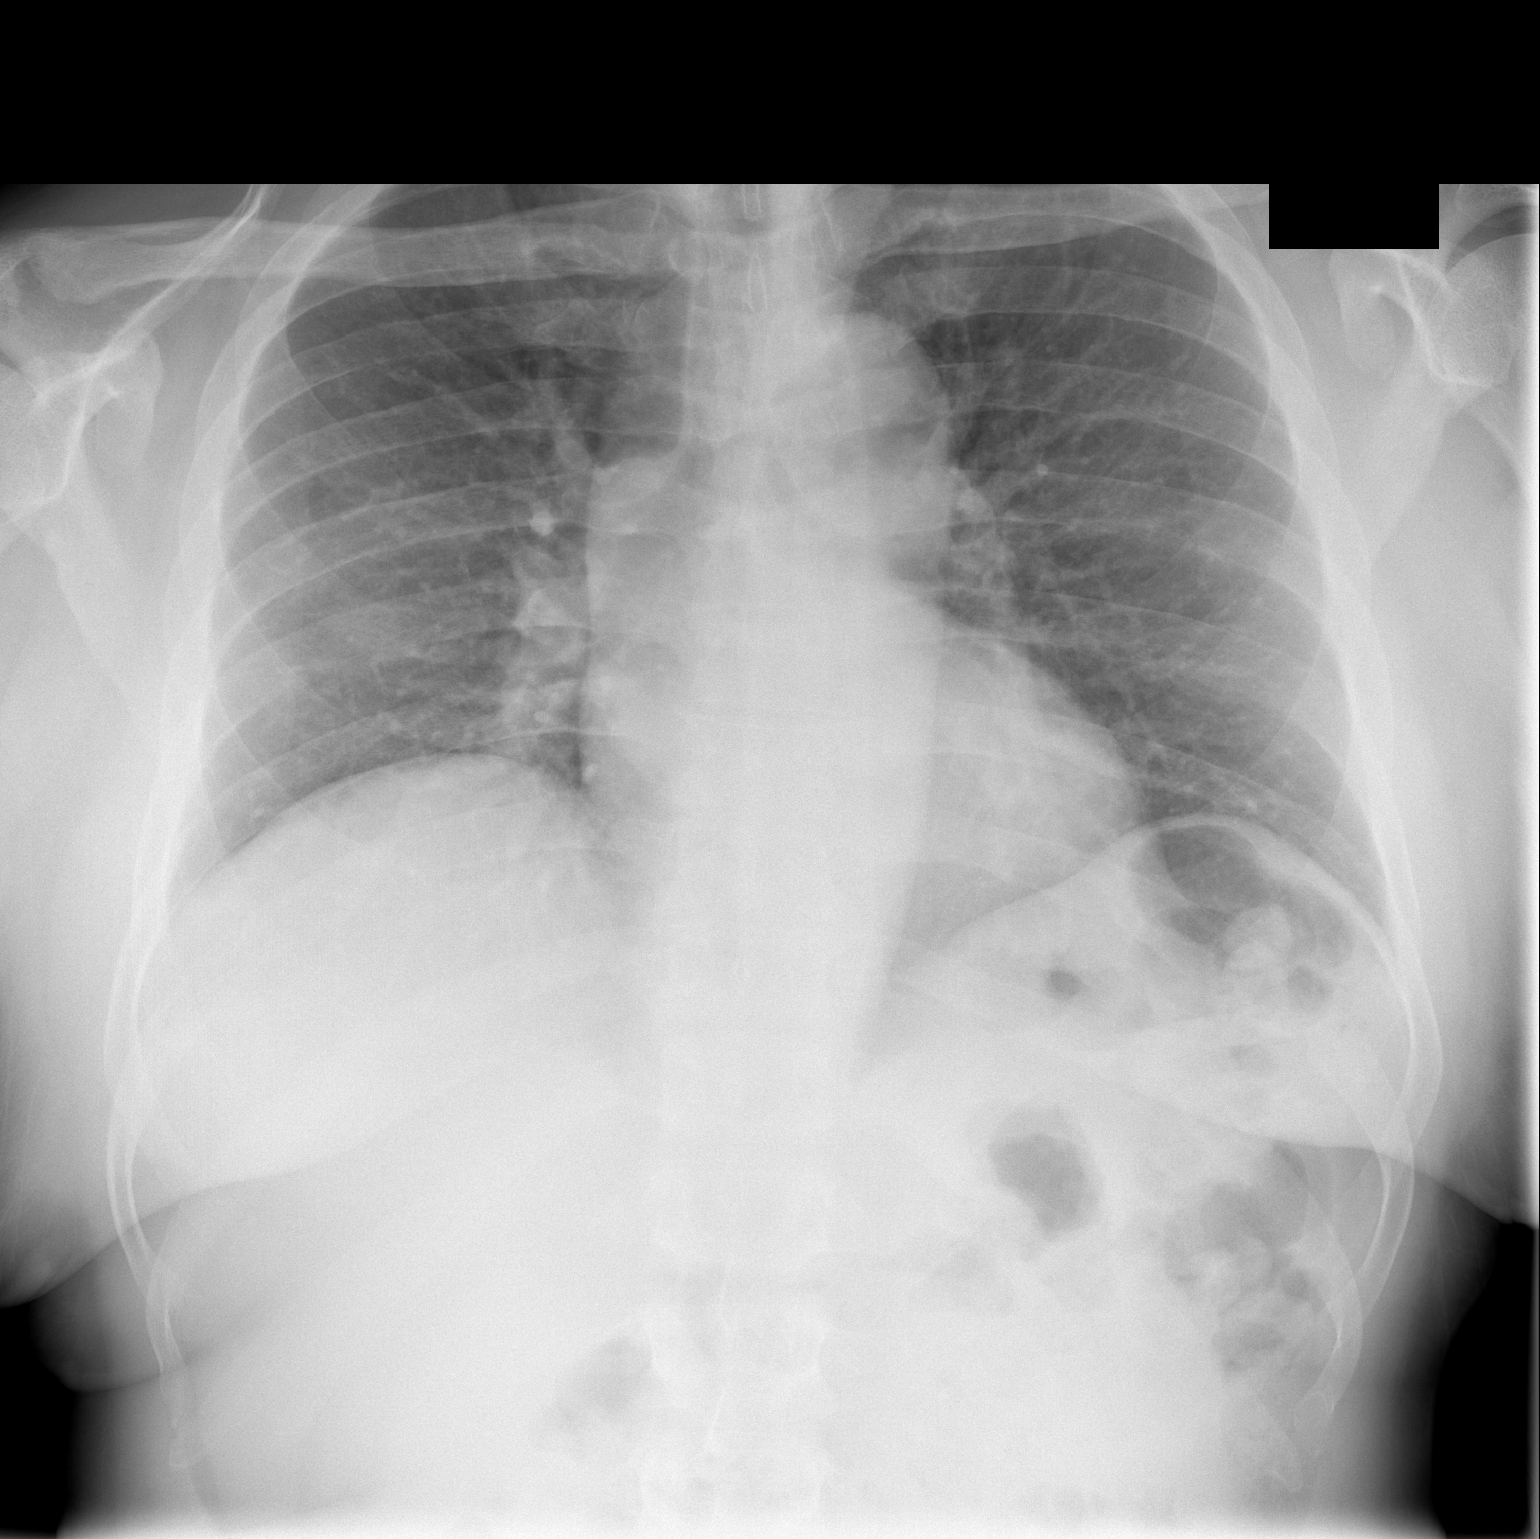

[w chest lat]
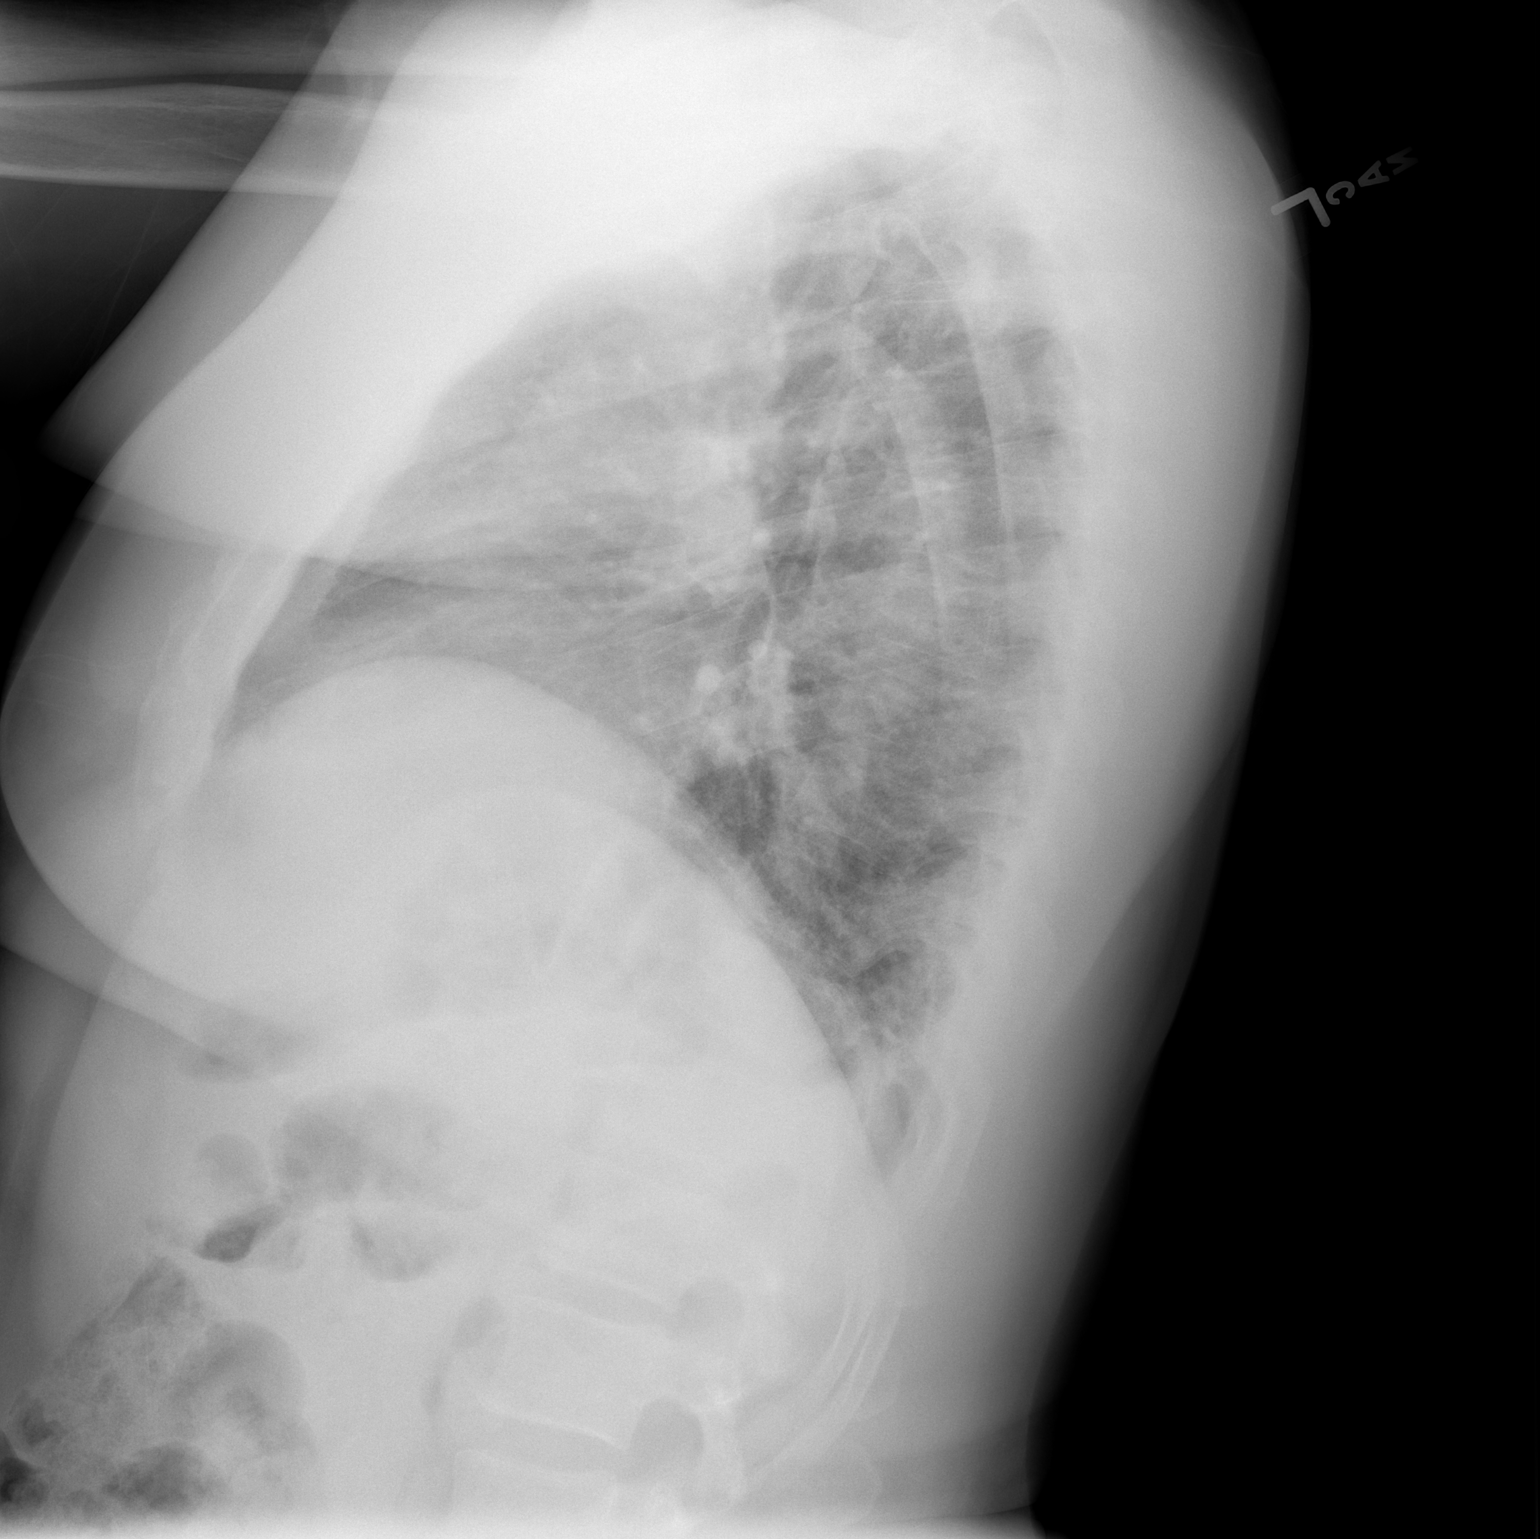

[2 of 2 positions shown; findings below may reference images not displayed]

FINDINGS: The heart size and mediastinal contours are within normal limits.
Both lungs are clear. The visualized skeletal structures are
unremarkable.
IMPRESSION: No active cardiopulmonary disease.

## 2015-09-21 IMAGING — CT CT ANGIO CHEST
2 of 7 series · 19 of 36 positions shown · IV contrast (APPLIED)
Comparison: Chest radiographs dated 12/27/2012

CLINICAL DATA: Shortness of breath, right pleuritic chest pain,
elevated D-dimer

EXAM:
CT ANGIOGRAPHY CHEST WITH CONTRAST
TECHNIQUE: Multidetector CT imaging of the chest was performed using the
standard protocol during bolus administration of intravenous
contrast. Multiplanar CT image reconstructions including MIPs were
obtained to evaluate the vascular anatomy.
CONTRAST:  100mL OMNIPAQUE IOHEXOL 350 MG/ML SOLN

[Series 8: pe 1.0 b26f · axial · 0.74mm/px · z∈[+36,+250]mm · 18 of 238 slices shown]
[im 12/238  lung]
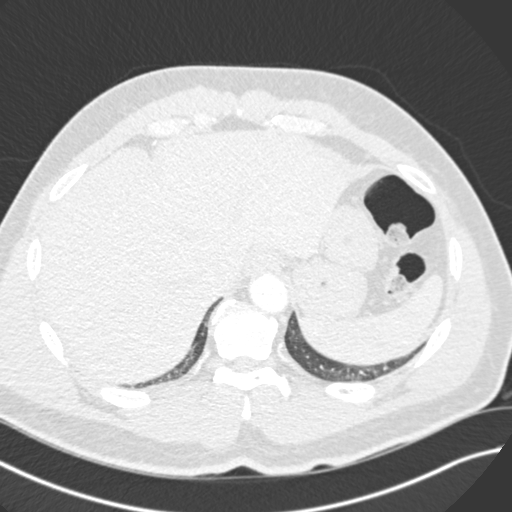
[im 24/238  mediastinal]
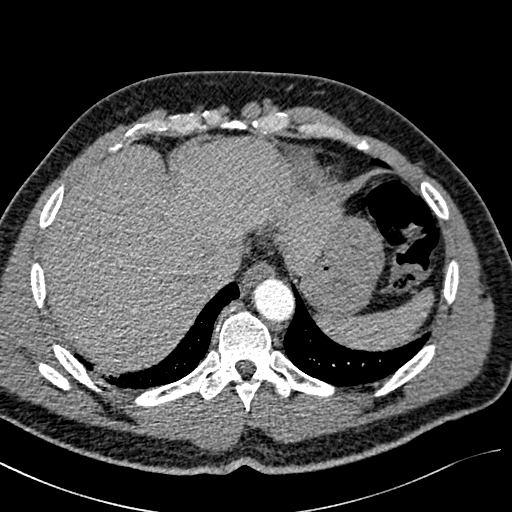
[im 36/238  lung]
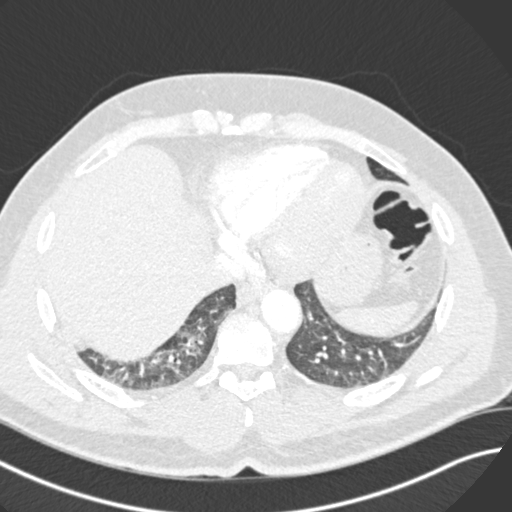
[im 48/238  mediastinal]
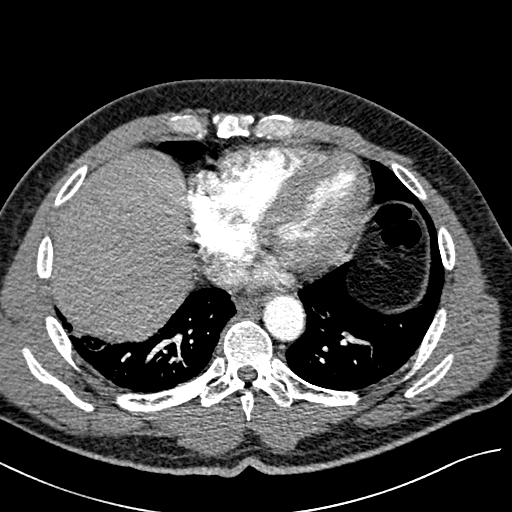
[im 60/238  lung]
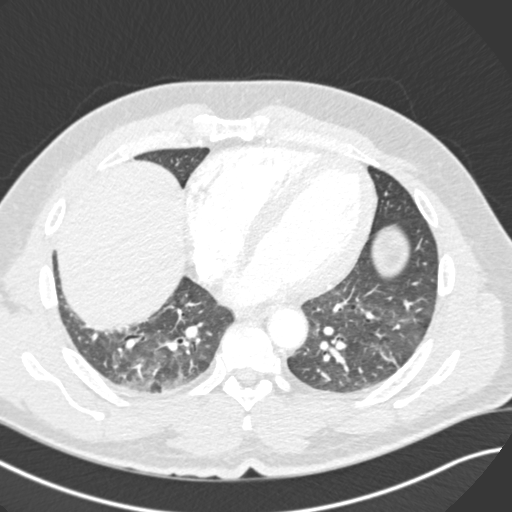
[im 72/238  mediastinal]
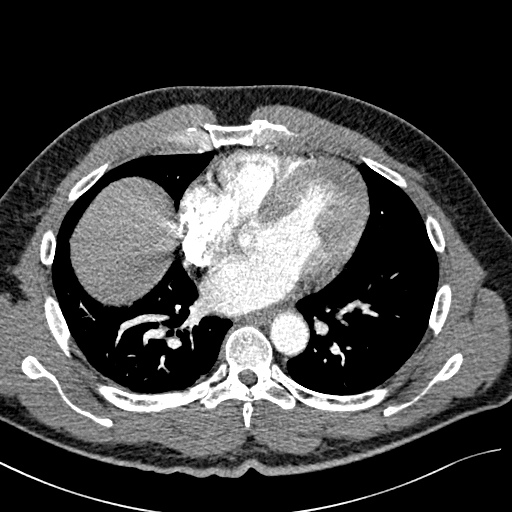
[im 83/238  lung]
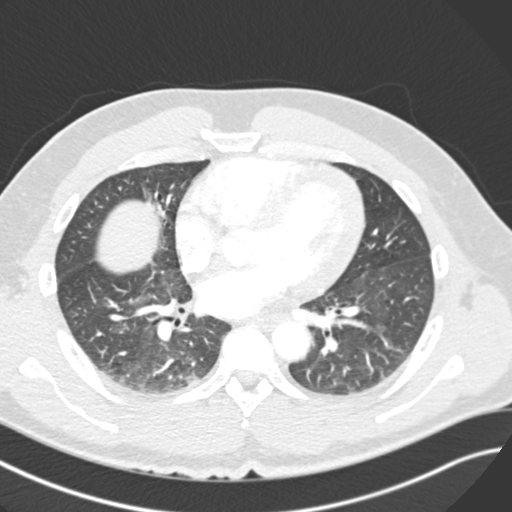
[im 95/238  mediastinal]
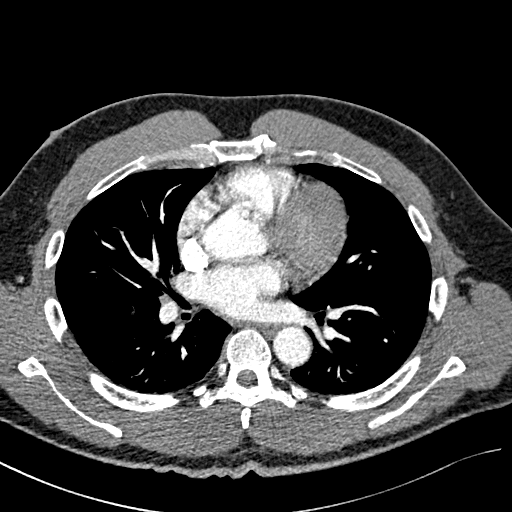
[im 107/238  lung]
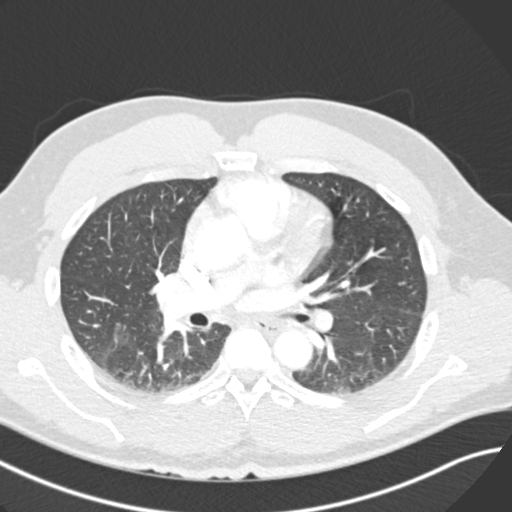
[im 131/238  mediastinal]
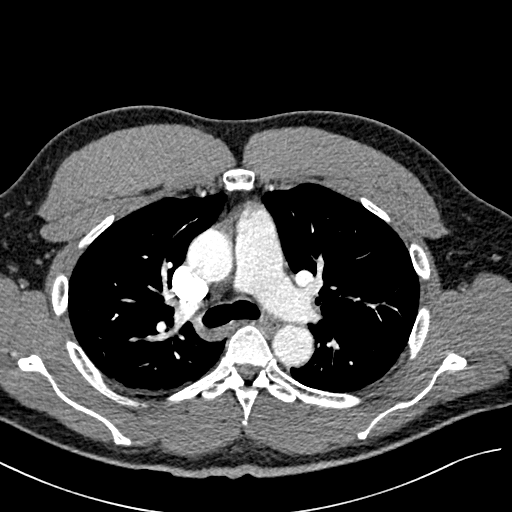
[im 143/238  lung]
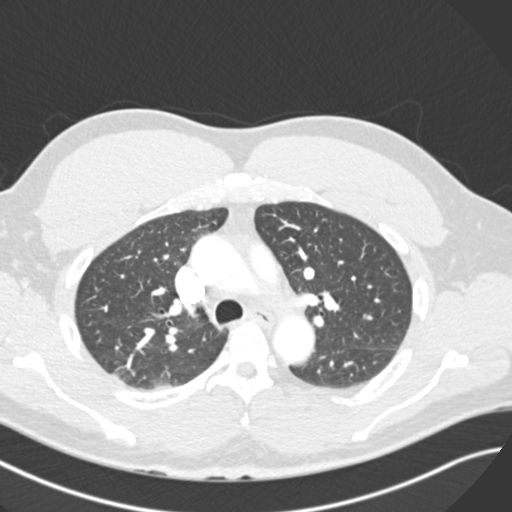
[im 155/238  mediastinal]
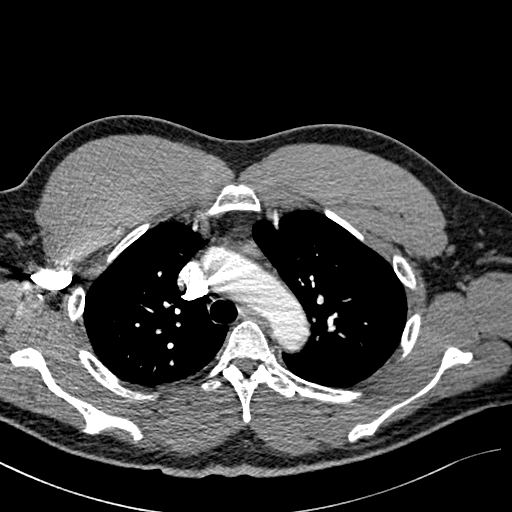
[im 166/238  lung]
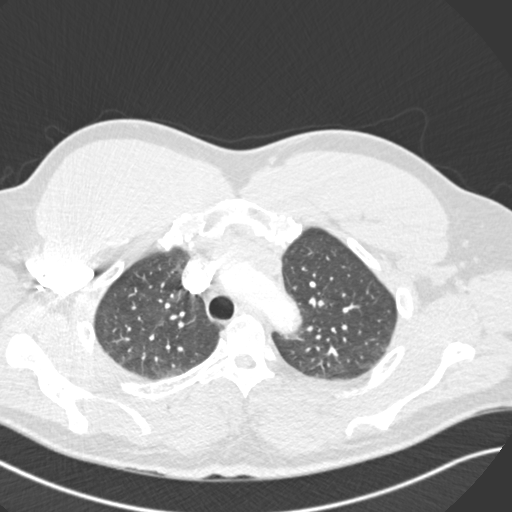
[im 178/238  mediastinal]
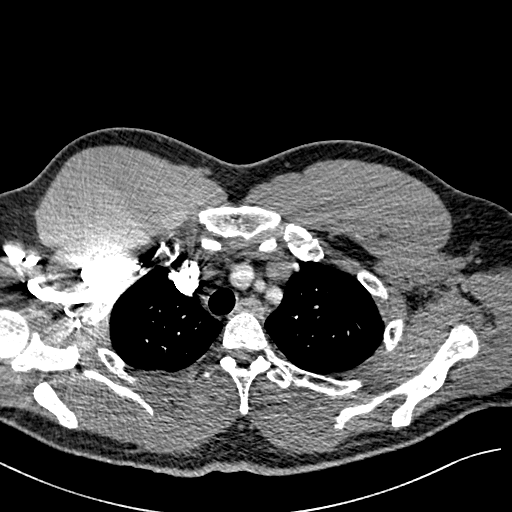
[im 190/238  lung]
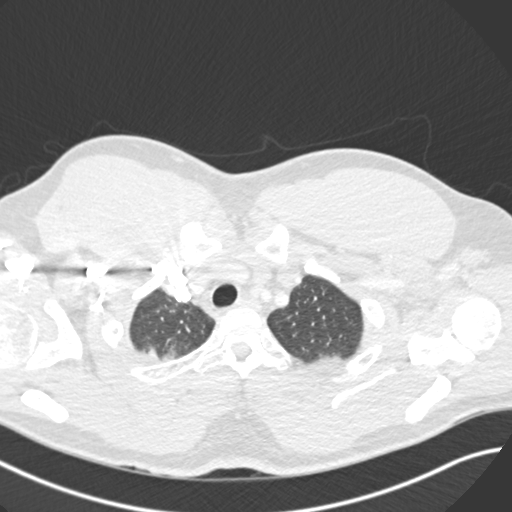
[im 202/238  mediastinal]
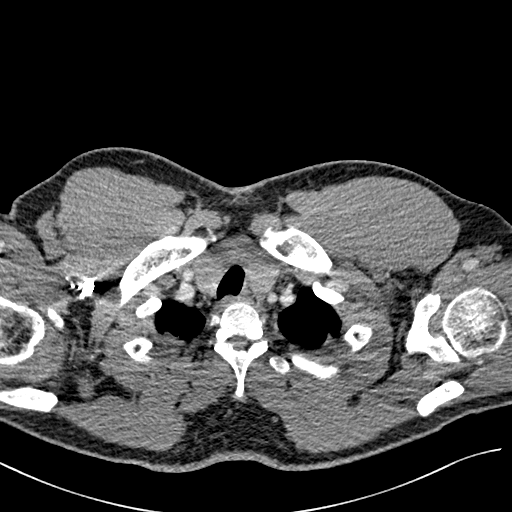
[im 214/238  lung]
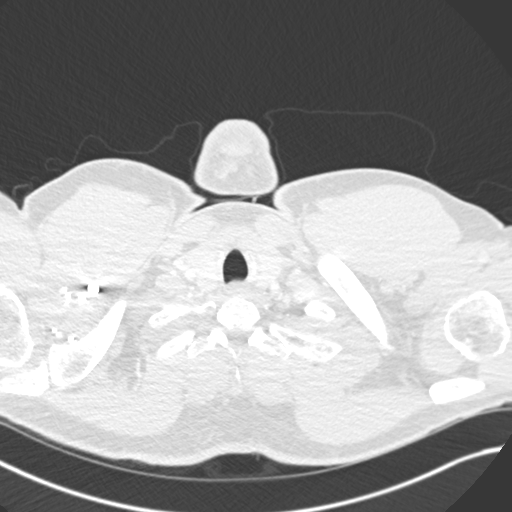
[im 226/238  mediastinal]
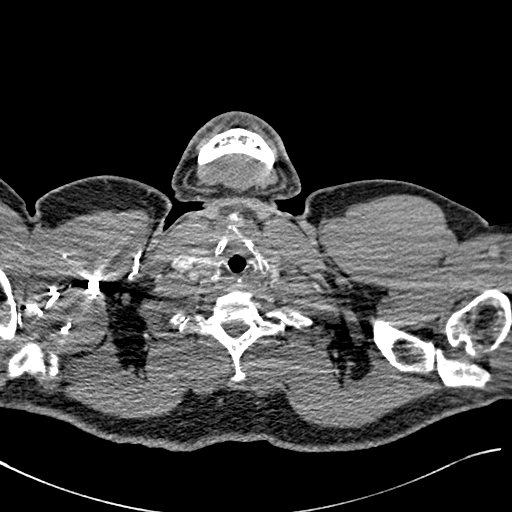

[Series 11: pe 2.0 coronal · coronal · 0.51mm/px · 1 of 105 slices shown]
[im 53/105  mediastinal]
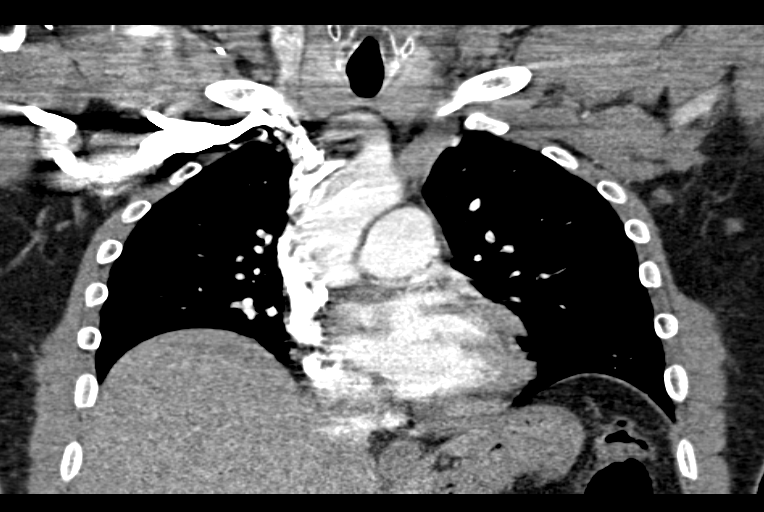

[19 of 36 positions shown; findings below may reference images not displayed]

FINDINGS: No evidence of pulmonary embolism.

Mild dependent atelectasis in the posterior/ dependent bilateral
upper and lower lobes. No suspicious pulmonary nodules. No pleural
effusion or pneumothorax.

Visualized thyroid is unremarkable.

The heart is normal in size. No pericardial effusion. Mild
atherosclerotic calcifications of the aortic arch.

No suspicious mediastinal, hilar, or axillary lymphadenopathy.

Visualized upper abdomen is unremarkable.

Mild degenerative changes at T10-11.

Review of the MIP images confirms the above findings.
IMPRESSION: No evidence of pulmonary embolism.

No evidence of acute cardiopulmonary disease.
